# Patient Record
Sex: Female | Born: 1970 | Race: White | Hispanic: No | Smoking: Current every day smoker
Health system: Southern US, Community
[De-identification: ages and names within clinical notes are randomized; demographics above are authoritative.]

## PROBLEM LIST (undated history)

## (undated) DIAGNOSIS — E2839 Other primary ovarian failure: Secondary | ICD-10-CM

## (undated) DIAGNOSIS — F329 Major depressive disorder, single episode, unspecified: Secondary | ICD-10-CM

## (undated) DIAGNOSIS — E288 Other ovarian dysfunction: Secondary | ICD-10-CM

## (undated) DIAGNOSIS — F32A Depression, unspecified: Secondary | ICD-10-CM

## (undated) HISTORY — DX: Depression, unspecified: F32.A

## (undated) HISTORY — DX: Major depressive disorder, single episode, unspecified: F32.9

## (undated) HISTORY — PX: POLYPECTOMY: SHX149

## (undated) HISTORY — DX: Other ovarian dysfunction: E28.8

## (undated) HISTORY — DX: Other primary ovarian failure: E28.39

## (undated) HISTORY — PX: WISDOM TOOTH EXTRACTION: SHX21

---

## 1999-03-10 ENCOUNTER — Emergency Department (HOSPITAL_COMMUNITY): Admission: EM | Admit: 1999-03-10 | Discharge: 1999-03-10 | Payer: Self-pay | Admitting: Emergency Medicine

## 1999-04-13 ENCOUNTER — Emergency Department (HOSPITAL_COMMUNITY): Admission: EM | Admit: 1999-04-13 | Discharge: 1999-04-13 | Payer: Self-pay | Admitting: Emergency Medicine

## 2002-12-21 ENCOUNTER — Emergency Department (HOSPITAL_COMMUNITY): Admission: AD | Admit: 2002-12-21 | Discharge: 2002-12-21 | Payer: Self-pay | Admitting: Family Medicine

## 2004-03-30 ENCOUNTER — Ambulatory Visit: Payer: Self-pay | Admitting: Psychiatry

## 2004-03-30 ENCOUNTER — Other Ambulatory Visit (HOSPITAL_COMMUNITY): Admission: RE | Admit: 2004-03-30 | Discharge: 2004-04-03 | Payer: Self-pay | Admitting: Psychiatry

## 2004-12-19 ENCOUNTER — Other Ambulatory Visit: Admission: RE | Admit: 2004-12-19 | Discharge: 2004-12-19 | Payer: Self-pay | Admitting: Obstetrics and Gynecology

## 2006-08-02 ENCOUNTER — Other Ambulatory Visit: Admission: RE | Admit: 2006-08-02 | Discharge: 2006-08-02 | Payer: Self-pay | Admitting: Obstetrics and Gynecology

## 2009-11-04 ENCOUNTER — Ambulatory Visit: Payer: Self-pay | Admitting: Gynecology

## 2009-11-04 ENCOUNTER — Other Ambulatory Visit: Admission: RE | Admit: 2009-11-04 | Discharge: 2009-11-04 | Payer: Self-pay | Admitting: Gynecology

## 2010-05-22 ENCOUNTER — Ambulatory Visit (INDEPENDENT_AMBULATORY_CARE_PROVIDER_SITE_OTHER): Payer: Self-pay | Admitting: Gynecology

## 2010-05-22 DIAGNOSIS — Z113 Encounter for screening for infections with a predominantly sexual mode of transmission: Secondary | ICD-10-CM

## 2010-05-22 DIAGNOSIS — N898 Other specified noninflammatory disorders of vagina: Secondary | ICD-10-CM

## 2010-05-22 DIAGNOSIS — N926 Irregular menstruation, unspecified: Secondary | ICD-10-CM

## 2010-05-22 DIAGNOSIS — B373 Candidiasis of vulva and vagina: Secondary | ICD-10-CM

## 2010-05-31 ENCOUNTER — Ambulatory Visit (INDEPENDENT_AMBULATORY_CARE_PROVIDER_SITE_OTHER): Payer: Self-pay | Admitting: Gynecology

## 2010-05-31 ENCOUNTER — Other Ambulatory Visit: Payer: Self-pay | Admitting: Gynecology

## 2010-05-31 DIAGNOSIS — N949 Unspecified condition associated with female genital organs and menstrual cycle: Secondary | ICD-10-CM

## 2010-06-23 ENCOUNTER — Other Ambulatory Visit: Payer: Self-pay | Admitting: Gynecology

## 2010-06-23 DIAGNOSIS — Z1231 Encounter for screening mammogram for malignant neoplasm of breast: Secondary | ICD-10-CM

## 2010-07-06 ENCOUNTER — Ambulatory Visit (HOSPITAL_COMMUNITY)
Admission: RE | Admit: 2010-07-06 | Discharge: 2010-07-06 | Disposition: A | Discharge status: Home / Self Care | Payer: Self-pay | Source: Ambulatory Visit | Attending: Gynecology | Admitting: Gynecology

## 2010-07-06 DIAGNOSIS — Z1231 Encounter for screening mammogram for malignant neoplasm of breast: Secondary | ICD-10-CM

## 2010-07-11 ENCOUNTER — Other Ambulatory Visit: Payer: Self-pay | Admitting: Gynecology

## 2010-07-11 DIAGNOSIS — R928 Other abnormal and inconclusive findings on diagnostic imaging of breast: Secondary | ICD-10-CM

## 2010-07-18 ENCOUNTER — Ambulatory Visit
Admission: RE | Admit: 2010-07-18 | Discharge: 2010-07-18 | Disposition: A | Discharge status: Home / Self Care | Payer: Self-pay | Source: Ambulatory Visit | Attending: Gynecology | Admitting: Gynecology

## 2010-07-18 DIAGNOSIS — R928 Other abnormal and inconclusive findings on diagnostic imaging of breast: Secondary | ICD-10-CM

## 2010-11-29 ENCOUNTER — Telehealth: Payer: Self-pay | Admitting: *Deleted

## 2010-11-29 NOTE — Telephone Encounter (Signed)
Pt called complaining of foul odor and black blood from vagina, pt wanted a rx called in to pharmacy. I told pt that she would need an office visit, she declined stating that JF has called in Rx for this before. Last office visit 05/31/10. I explained to pt that Jf is out of the office today and tomorrow and she could be seen by another doctor. Pt states she has no insurance,offer to taik to appointment desk to set up payment plan, she declined. Pt says she will watch for now and call back in a couple of days to make appointment.

## 2010-12-06 ENCOUNTER — Encounter: Payer: Self-pay | Admitting: Gynecology

## 2010-12-06 ENCOUNTER — Ambulatory Visit (INDEPENDENT_AMBULATORY_CARE_PROVIDER_SITE_OTHER): Payer: Self-pay | Admitting: Gynecology

## 2010-12-06 VITALS — BP 120/80

## 2010-12-06 DIAGNOSIS — N949 Unspecified condition associated with female genital organs and menstrual cycle: Secondary | ICD-10-CM

## 2010-12-06 DIAGNOSIS — N912 Amenorrhea, unspecified: Secondary | ICD-10-CM

## 2010-12-06 DIAGNOSIS — N898 Other specified noninflammatory disorders of vagina: Secondary | ICD-10-CM

## 2010-12-06 DIAGNOSIS — N938 Other specified abnormal uterine and vaginal bleeding: Secondary | ICD-10-CM

## 2010-12-06 LAB — POCT URINE PREGNANCY: Preg Test, Ur: NEGATIVE

## 2010-12-06 NOTE — Patient Instructions (Signed)
Hubert, remember to take the Flagyl 500 mg one tablet twice a day for 5 days. The boric acid capsules (600 mg) is to be applied intravaginally every night for 2 weeks consecutively. After those 2 weeks I would like for you to apply it intravaginally twice a week for 3-6 months. Also cut down on sweets and dairy products and no vaginal douching. Also you can by a probiotic tablet such as refresh or other whatever is less expensive and you should take 1 tablet daily. We'll see her next week for the sonohysterogram and discuss the lab results that were drawn today.

## 2010-12-06 NOTE — Progress Notes (Signed)
Patient presented today to the office stating that she has had mucousy blood-tinged discharge for several weeks. Review of her records indicated she was seen in the office on April of 2012 for dysfunction uterine bleeding and had a benign endometrial biopsy. She has not been using any form of contraception cannot recall what her last menstrual period was. Also review of her record again July 2012 she was treated for bacterial vaginosis and yeast infection based on a telephone conversation and due to limited resources and could not come to the office. She is in a monogamous relationship. Patient denies any visual disturbances, unusual headaches, or nipple discharge.  Pelvic exam: Bartholin urethra Skene glands within normal limits Vagina: No lesions or discharge Cervix: No lesion discharge Uterus: Anteverted normal size shape and consistency Adnexa: No palpable masses or tenderness Rectal exam: Not done  Wet prep: Bacterial vaginosis and moniliasis  Assessment: Oligomenorrhea and vaginal discharge. Early this year benign endometrial biopsy. Patient will return back to the office next week for sonohysterogram for better assessment of intrauterine cavity. Urine pregnancy test was negative today. We'll have patient stop by the lab to check TSH and prolactin. She'll be treated with Flagyl 500 mg one by mouth twice a day for 5 days. And due to recurrent yeast infections as well we'll place her on boric acid capsule 650 mg one tablet intravaginally each bedtime for 2 weeks then twice a week thereafter for 3-6 months. She will cut down on sweets and dairy products and avoid douching. We will discuss course of management and she returned to the office next week. She is a chronic smoker so oral contraceptive pills would be a question. We did discuss the Mirena IUD and or Provera 10 mg one tablet daily for 10 days of each month.

## 2010-12-07 ENCOUNTER — Telehealth: Payer: Self-pay | Admitting: *Deleted

## 2010-12-07 NOTE — Telephone Encounter (Signed)
Pt called wanting recent lab result, results given to pt. She also wanted rx for stress related issues, I told pt that JF is out of the office until next Wednesday. Pt says she will call back then.

## 2010-12-28 ENCOUNTER — Telehealth: Payer: Self-pay | Admitting: *Deleted

## 2010-12-28 ENCOUNTER — Other Ambulatory Visit: Payer: Self-pay | Admitting: Gynecology

## 2010-12-28 ENCOUNTER — Other Ambulatory Visit: Payer: Self-pay | Admitting: *Deleted

## 2010-12-28 DIAGNOSIS — R921 Mammographic calcification found on diagnostic imaging of breast: Secondary | ICD-10-CM

## 2010-12-28 NOTE — Telephone Encounter (Signed)
Pt called stating she need a order for 6 month follow up at the breast center. Pt informed that the breast center will fax [place] the order for this. Pt also stated that she has brownish blood then bright red blood discharge while on boric acid for the first two weeks. Pt is no longer having this discharge and was told to watch for now if discharge should return she will call office.

## 2011-01-12 ENCOUNTER — Encounter: Payer: Self-pay | Admitting: Gynecology

## 2011-01-17 ENCOUNTER — Ambulatory Visit
Admission: RE | Admit: 2011-01-17 | Discharge: 2011-01-17 | Disposition: A | Discharge status: Home / Self Care | Payer: No Typology Code available for payment source | Source: Ambulatory Visit | Attending: Gynecology | Admitting: Gynecology

## 2011-01-17 DIAGNOSIS — R921 Mammographic calcification found on diagnostic imaging of breast: Secondary | ICD-10-CM

## 2011-07-16 ENCOUNTER — Other Ambulatory Visit: Payer: Self-pay | Admitting: *Deleted

## 2011-07-16 DIAGNOSIS — R921 Mammographic calcification found on diagnostic imaging of breast: Secondary | ICD-10-CM

## 2011-08-06 ENCOUNTER — Ambulatory Visit
Admission: RE | Admit: 2011-08-06 | Discharge: 2011-08-06 | Disposition: A | Discharge status: Home / Self Care | Payer: Self-pay | Source: Ambulatory Visit | Attending: Gynecology | Admitting: Gynecology

## 2011-08-06 DIAGNOSIS — R921 Mammographic calcification found on diagnostic imaging of breast: Secondary | ICD-10-CM

## 2011-11-13 ENCOUNTER — Telehealth: Payer: Self-pay | Admitting: *Deleted

## 2011-11-13 NOTE — Telephone Encounter (Signed)
Pt called c/o UTI requesting rx. Pt last appointment in oct. 2012. Pt informed OV best. Pt said she will call back to make OV.

## 2012-01-18 ENCOUNTER — Telehealth: Payer: Self-pay | Admitting: *Deleted

## 2012-01-18 DIAGNOSIS — R921 Mammographic calcification found on diagnostic imaging of breast: Secondary | ICD-10-CM

## 2012-01-18 NOTE — Telephone Encounter (Signed)
Per breast center pt will need 6 month follow up for left breast, order placed and pt informed to call.

## 2012-01-21 ENCOUNTER — Encounter (HOSPITAL_COMMUNITY): Payer: Self-pay | Admitting: *Deleted

## 2012-01-29 ENCOUNTER — Ambulatory Visit (HOSPITAL_COMMUNITY): Payer: Self-pay

## 2012-02-25 ENCOUNTER — Emergency Department (INDEPENDENT_AMBULATORY_CARE_PROVIDER_SITE_OTHER)
Admission: EM | Admit: 2012-02-25 | Discharge: 2012-02-25 | Disposition: A | Discharge status: Home / Self Care | Payer: Self-pay | Source: Home / Self Care

## 2012-02-25 ENCOUNTER — Encounter (HOSPITAL_COMMUNITY): Payer: Self-pay | Admitting: Emergency Medicine

## 2012-02-25 DIAGNOSIS — H60399 Other infective otitis externa, unspecified ear: Secondary | ICD-10-CM

## 2012-02-25 DIAGNOSIS — H6090 Unspecified otitis externa, unspecified ear: Secondary | ICD-10-CM

## 2012-02-25 DIAGNOSIS — J029 Acute pharyngitis, unspecified: Secondary | ICD-10-CM

## 2012-02-25 LAB — POCT INFECTIOUS MONO SCREEN: Mono Screen: NEGATIVE

## 2012-02-25 LAB — POCT RAPID STREP A: Streptococcus, Group A Screen (Direct): NEGATIVE

## 2012-02-25 MED ORDER — NEOMYCIN-POLYMYXIN-HC 3.5-10000-1 OT SOLN: 3.0000 [drp] | Freq: Three times a day (TID) | OTIC | Status: DC

## 2012-02-25 MED ORDER — AMOXICILLIN-POT CLAVULANATE 875-125 MG PO TABS: 1.0000 | ORAL_TABLET | Freq: Two times a day (BID) | ORAL | Status: DC

## 2012-02-25 MED ORDER — HYDROCODONE-ACETAMINOPHEN 5-325 MG PO TABS: 1.0000 | ORAL_TABLET | ORAL | Status: DC | PRN

## 2012-02-25 NOTE — ED Notes (Signed)
Pt c/o right ear pain w/decraesed hearing x1.5 weeks Sx include: sore throat, dry cough, chills Denies: fevers, vomiting, nauseas, diarrhea She is alert w/no signs of acute distress

## 2012-02-25 NOTE — ED Provider Notes (Signed)
History     CSN: [id number]  Arrival date & time 02/25/12  1622   First MD Initiated Contact with Patient 02/25/12 1700      Chief Complaint  Patient presents with  . Otalgia    (Consider location/radiation/quality/duration/timing/severity/associated sxs/prior treatment) HPI Comments: 42 year old female presents with right earache for almost one week. She initially tried removing wax from her ear but that did not help. She is also complaining of decreased hearing from the right ear. States ears feel hot and she is has a sore throat and chills but no fever. Is also complaining of fatigue and malaise. She is a smoker.   Past Medical History  Diagnosis Date  . Depression     History reviewed. No pertinent past surgical history.  Family History  Problem Relation Age of Onset  . Breast cancer Mother   . Cancer Father     thyroid  . Hypertension Father   . Cancer Sister 88    thyroid  . Breast cancer Paternal Grandmother   . Cancer Daughter 74    cervical    History  Substance Use Topics  . Smoking status: Current Every Day Smoker -- 0.5 packs/day    Types: Cigarettes  . Smokeless tobacco: Never Used  . Alcohol Use: No    OB History    Grav Para Term Preterm Abortions TAB SAB Ect Mult Living   4 3 3  1  1   3       Review of Systems  Constitutional: Positive for fatigue. Negative for fever, chills, activity change and appetite change.  HENT: Positive for sore throat. Negative for congestion, facial swelling, rhinorrhea, neck pain, neck stiffness and postnasal drip.   Eyes: Negative.   Respiratory: Negative.   Cardiovascular: Negative.   Gastrointestinal: Negative.   Genitourinary: Negative.   Skin: Negative for pallor and rash.  Neurological: Negative.     Allergies  Review of patient's allergies indicates no known allergies.  Home Medications   Current Outpatient Rx  Name  Route  Sig  Dispense  Refill  . AMOXICILLIN-POT CLAVULANATE 875-125 MG PO TABS   Oral   Take 1 tablet by mouth every 12 (twelve) hours.   14 tablet   0   . BUPROPION HCL 100 MG PO TABS   Oral   Take 100 mg by mouth 2 (two) times daily.           Marland Kitchen FLAX SEED OIL 1000 MG PO CAPS   Oral   Take by mouth.           Marland Kitchen HYDROCODONE-ACETAMINOPHEN 5-325 MG PO TABS   Oral   Take 1 tablet by mouth every 4 (four) hours as needed for pain.   15 tablet   0   . HAIR VITAMINS PO   Oral   Take by mouth.             BP 134/81  Pulse 82  Temp 98 F (36.7 C) (Oral)  Resp 16  SpO2 100%  Physical Exam  Nursing note and vitals reviewed. Constitutional: She is oriented to person, place, and time. She appears well-developed and well-nourished. No distress.  HENT:       Left EAC and TM are normal. Right EAC is swollen and unable to visualize the TM. There is no redness or drainage however the EAC is quite tender. Oropharynx with erythema  and mild swelling; no exudate  Eyes: Conjunctivae normal and EOM are normal.  Neck: Normal range of  motion. Neck supple.       Right anterior cervical lymphadenopathy. Right posterior cervical lymphadenopathy.  Cardiovascular: Normal rate, regular rhythm and normal heart sounds.   Pulmonary/Chest: Effort normal and breath sounds normal. No respiratory distress. She has no wheezes.  Musculoskeletal: Normal range of motion. She exhibits no edema.  Lymphadenopathy:    She has cervical adenopathy.  Neurological: She is alert and oriented to person, place, and time.  Skin: Skin is warm and dry. No rash noted.  Psychiatric: She has a normal mood and affect.    ED Course  Procedures (including critical care time)   Labs Reviewed  POCT RAPID STREP A ([facility] ONLY)  POCT INFECTIOUS MONO SCREEN   No results found.   1. Otitis externa   2. Pharyngitis       MDM  Cortisporin HC otic drops 3 times a day. I inserted an ear wick and applied 4 drops into the right ear canal. She will have the remainder of the pack to take  home and change every day and apply drops 3 times a day Augmentin as directed Norco 5 one every 4 hours when necessary pain #15 I also suspect that she may have a viral syndrome with her minor sore throat or, fatigue and malaise. Treatment plan fluids Results for orders placed during the hospital encounter of 02/25/12  POCT RAPID STREP A ([facility] ONLY)      Component Value Range   Streptococcus, Group A Screen (Direct) NEGATIVE  NEGATIVE  POCT INFECTIOUS MONO SCREEN      Component Value Range   Mono Screen NEGATIVE  NEGATIVE           Hayden Rasmussen, NP 02/25/12 1835

## 2012-02-25 NOTE — ED Notes (Signed)
Waiting discharge papers 

## 2012-02-26 NOTE — ED Notes (Signed)
Pt called and questioned directions - discussed with david mabe - pt instructions reviewed again with pt - verbalized understanding

## 2012-02-27 NOTE — ED Notes (Signed)
Patient called, asking to be out of work until end of week; spoke w D Mabe, NP, and this request has been denied. Patient was informed of this, and advised that if she cannot work in AM, will need to be re-examined here or by her primary MD

## 2012-03-01 NOTE — ED Provider Notes (Signed)
Medical screening examination/treatment/procedure(s) were performed by resident physician or non-physician practitioner and as supervising physician I was immediately available for consultation/collaboration.   Barkley Bruns MD.    Linna Hoff, MD 03/01/12 [phone]

## 2012-04-09 ENCOUNTER — Ambulatory Visit
Admission: RE | Admit: 2012-04-09 | Discharge: 2012-04-09 | Disposition: A | Discharge status: Home / Self Care | Payer: No Typology Code available for payment source | Source: Ambulatory Visit | Attending: Gynecology | Admitting: Gynecology

## 2012-04-10 ENCOUNTER — Encounter (HOSPITAL_COMMUNITY): Payer: Self-pay | Admitting: *Deleted

## 2012-04-15 ENCOUNTER — Other Ambulatory Visit: Payer: Self-pay | Admitting: Obstetrics and Gynecology

## 2012-04-22 ENCOUNTER — Encounter (HOSPITAL_COMMUNITY): Payer: Self-pay

## 2012-04-22 ENCOUNTER — Ambulatory Visit (HOSPITAL_COMMUNITY)
Admission: RE | Admit: 2012-04-22 | Discharge: 2012-04-22 | Disposition: A | Discharge status: Home / Self Care | Payer: Self-pay | Source: Ambulatory Visit | Attending: Obstetrics and Gynecology | Admitting: Obstetrics and Gynecology

## 2012-04-22 VITALS — BP 116/74 | Temp 98.4°F | Ht 65.0 in | Wt 139.4 lb

## 2012-04-22 NOTE — Patient Instructions (Signed)
Taught patient how to perform BSE. Patient did not need a Pap smear today due to last Pap smear was 11/04/2009. Let her know that her next Pap smear is due in September 2014. Told her she can call [facility] around September and schedule an appointment for her Pap smear. Let her know she can also come to one of the free Pap smears this fall. Referred patient to the [facility] for left breast biopsy per recommendation. Appointment scheduled for Wednesday, April 23, 2012 at 1300. Patient aware of appointment and will be there. Patient verbalized understanding.

## 2012-04-22 NOTE — Progress Notes (Signed)
Patient referred to [facility] by the [facility] due to recommending a left breast biopsy. Left breast diagnostic mammogram completed at the [facility] 04/09/2012.  Pap Smear:    Pap smear not completed today. Last Pap smear was 11/04/2009 and normal. Per patient no history of abnormal Pap smears. Last Pap smear result is in EPIC.  Physical exam: Breasts Breasts symmetrical. No skin abnormalities bilateral breasts. No nipple retraction bilateral breasts. No nipple discharge bilateral breasts. No lymphadenopathy. No lumps palpated right breast. Palpated a small lump within the left breast at 1 o'clock around 2 cm from the nipple. No complaints of pain or tenderness on exam. Referred patient to the [facility] for left breast biopsy per recommendation. Appointment scheduled for Wednesday, April 23, 2012 at 1300     Pelvic/Bimanual No Pap smear completed today since last Pap smear was 11/04/2009. Pap smear not indicated per BCCCP guidelines.

## 2012-04-23 ENCOUNTER — Ambulatory Visit
Admission: RE | Admit: 2012-04-23 | Discharge: 2012-04-23 | Disposition: A | Discharge status: Home / Self Care | Payer: No Typology Code available for payment source | Source: Ambulatory Visit | Attending: Obstetrics and Gynecology | Admitting: Obstetrics and Gynecology

## 2012-04-23 HISTORY — PX: BREAST BIOPSY: SHX20

## 2012-09-30 ENCOUNTER — Other Ambulatory Visit: Payer: Self-pay

## 2012-09-30 DIAGNOSIS — Z1231 Encounter for screening mammogram for malignant neoplasm of breast: Secondary | ICD-10-CM

## 2012-10-20 ENCOUNTER — Ambulatory Visit: Payer: Self-pay

## 2012-10-20 ENCOUNTER — Ambulatory Visit
Admission: RE | Admit: 2012-10-20 | Discharge: 2012-10-20 | Disposition: A | Discharge status: Home / Self Care | Payer: No Typology Code available for payment source | Source: Ambulatory Visit

## 2012-10-20 DIAGNOSIS — Z1231 Encounter for screening mammogram for malignant neoplasm of breast: Secondary | ICD-10-CM

## 2012-10-28 ENCOUNTER — Encounter (HOSPITAL_COMMUNITY): Payer: Self-pay

## 2012-10-28 ENCOUNTER — Ambulatory Visit (HOSPITAL_COMMUNITY)
Admission: RE | Admit: 2012-10-28 | Discharge: 2012-10-28 | Disposition: A | Discharge status: Home / Self Care | Payer: Self-pay | Source: Ambulatory Visit | Attending: Obstetrics and Gynecology | Admitting: Obstetrics and Gynecology

## 2012-10-28 VITALS — BP 120/72 | Temp 98.7°F | Ht 65.5 in | Wt 143.0 lb

## 2012-10-28 DIAGNOSIS — Z01419 Encounter for gynecological examination (general) (routine) without abnormal findings: Secondary | ICD-10-CM

## 2012-10-28 DIAGNOSIS — N898 Other specified noninflammatory disorders of vagina: Secondary | ICD-10-CM

## 2012-10-28 NOTE — Patient Instructions (Signed)
Informed patient that if today's Pap smear is normal that her next Pap smear will be due in 3 years. Let patient know will follow up with her within the next couple weeks with results by phone. Melanie Owens verbalized understanding.  Melanie Owens, Melanie Maser, RN 1:18 PM

## 2012-10-28 NOTE — Progress Notes (Signed)
No complaints today.  Pap Smear:    Pap smear completed today. Patients last Pap smear was 11/04/2009 and normal. Per patient has no history of an abnormal Pap smear. Pap smear result above is in EPIC.    Pelvic/Bimanual   Ext Genitalia No lesions, no swelling and no discharge observed on external genitalia.         Vagina Vagina pink and normal texture. No lesions and white milky discharge observed in vagina. Wet prep completed.          Cervix Cervix is present. Cervix slightly red and of normal texture. White milky discharge observed on cervix.Marland Kitchen     Uterus Uterus is present and palpable. Uterus in normal position and normal size.        Adnexae Bilateral ovaries present and palpable. No tenderness on palpation.          Rectovaginal No rectal exam completed today since patient had no rectal complaints. No skin abnormalities observed on exam.

## 2012-11-03 ENCOUNTER — Telehealth (HOSPITAL_COMMUNITY): Payer: Self-pay | Admitting: *Deleted

## 2012-11-03 ENCOUNTER — Other Ambulatory Visit: Payer: Self-pay | Admitting: *Deleted

## 2012-11-03 DIAGNOSIS — B9689 Other specified bacterial agents as the cause of diseases classified elsewhere: Secondary | ICD-10-CM

## 2012-11-03 DIAGNOSIS — B379 Candidiasis, unspecified: Secondary | ICD-10-CM

## 2012-11-03 MED ORDER — FLUCONAZOLE 150 MG PO TABS: 150.0000 mg | ORAL_TABLET | Freq: Once | ORAL | Status: DC

## 2012-11-03 MED ORDER — METRONIDAZOLE 500 MG PO TABS: 500.0000 mg | ORAL_TABLET | Freq: Two times a day (BID) | ORAL | Status: DC

## 2012-11-03 NOTE — Telephone Encounter (Signed)
Telephoned patient at home # and discussed negative pap smear results. Next pap due in 3 years. Advised patient of wet prep results and medication called in to pharmacy for bacterial vaginosis and yeast. Patient voiced understanding.

## 2013-04-16 ENCOUNTER — Other Ambulatory Visit: Payer: Self-pay

## 2013-04-16 DIAGNOSIS — Z1231 Encounter for screening mammogram for malignant neoplasm of breast: Secondary | ICD-10-CM

## 2013-05-29 ENCOUNTER — Ambulatory Visit (INDEPENDENT_AMBULATORY_CARE_PROVIDER_SITE_OTHER): Payer: BC Managed Care – PPO | Admitting: Gynecology

## 2013-05-29 ENCOUNTER — Encounter: Payer: Self-pay | Admitting: Gynecology

## 2013-05-29 VITALS — BP 128/78 | Ht 65.0 in | Wt 148.0 lb

## 2013-05-29 DIAGNOSIS — F17200 Nicotine dependence, unspecified, uncomplicated: Secondary | ICD-10-CM

## 2013-05-29 DIAGNOSIS — N898 Other specified noninflammatory disorders of vagina: Secondary | ICD-10-CM

## 2013-05-29 DIAGNOSIS — N760 Acute vaginitis: Secondary | ICD-10-CM

## 2013-05-29 DIAGNOSIS — R143 Flatulence: Secondary | ICD-10-CM

## 2013-05-29 DIAGNOSIS — K921 Melena: Secondary | ICD-10-CM

## 2013-05-29 DIAGNOSIS — R141 Gas pain: Secondary | ICD-10-CM

## 2013-05-29 DIAGNOSIS — A499 Bacterial infection, unspecified: Secondary | ICD-10-CM

## 2013-05-29 DIAGNOSIS — R142 Eructation: Secondary | ICD-10-CM

## 2013-05-29 DIAGNOSIS — R140 Abdominal distension (gaseous): Secondary | ICD-10-CM | POA: Insufficient documentation

## 2013-05-29 DIAGNOSIS — B9689 Other specified bacterial agents as the cause of diseases classified elsewhere: Secondary | ICD-10-CM

## 2013-05-29 DIAGNOSIS — N912 Amenorrhea, unspecified: Secondary | ICD-10-CM

## 2013-05-29 DIAGNOSIS — Z803 Family history of malignant neoplasm of breast: Secondary | ICD-10-CM | POA: Insufficient documentation

## 2013-05-29 DIAGNOSIS — N951 Menopausal and female climacteric states: Secondary | ICD-10-CM

## 2013-05-29 DIAGNOSIS — N643 Galactorrhea not associated with childbirth: Secondary | ICD-10-CM | POA: Insufficient documentation

## 2013-05-29 LAB — WET PREP FOR TRICH, YEAST, CLUE
Trich, Wet Prep: NONE SEEN
YEAST WET PREP: NONE SEEN

## 2013-05-29 LAB — PREGNANCY, URINE: Preg Test, Ur: NEGATIVE

## 2013-05-29 MED ORDER — TINIDAZOLE 500 MG PO TABS: ORAL_TABLET | ORAL | Status: DC

## 2013-05-29 NOTE — Patient Instructions (Signed)
Smoking Cessation Quitting smoking is important to your health and has many advantages. However, it is not always easy to quit since nicotine is a very addictive drug. Often times, people try 3 times or more before being able to quit. This document explains the best ways for you to prepare to quit smoking. Quitting takes hard work and a lot of effort, but you can do it. ADVANTAGES OF QUITTING SMOKING  You will live longer, feel better, and live better.  Your body will feel the impact of quitting smoking almost immediately.  Within 20 minutes, blood pressure decreases. Your pulse returns to its normal level.  After 8 hours, carbon monoxide levels in the blood return to normal. Your oxygen level increases.  After 24 hours, the chance of having a heart attack starts to decrease. Your breath, hair, and body stop smelling like smoke.  After 48 hours, damaged nerve endings begin to recover. Your sense of taste and smell improve.  After 72 hours, the body is virtually free of nicotine. Your bronchial tubes relax and breathing becomes easier.  After 2 to 12 weeks, lungs can hold more air. Exercise becomes easier and circulation improves.  The risk of having a heart attack, stroke, cancer, or lung disease is greatly reduced.  After 1 year, the risk of coronary heart disease is cut in half.  After 5 years, the risk of stroke falls to the same as a nonsmoker.  After 10 years, the risk of lung cancer is cut in half and the risk of other cancers decreases significantly.  After 15 years, the risk of coronary heart disease drops, usually to the level of a nonsmoker.  If you are pregnant, quitting smoking will improve your chances of having a healthy baby.  The people you live with, especially any children, will be healthier.  You will have extra money to spend on things other than cigarettes. QUESTIONS TO THINK ABOUT BEFORE ATTEMPTING TO QUIT You may want to talk about your answers with your  caregiver.  Why do you want to quit?  If you tried to quit in the past, what helped and what did not?  What will be the most difficult situations for you after you quit? How will you plan to handle them?  Who can help you through the tough times? Your family? Friends? A caregiver?  What pleasures do you get from smoking? What ways can you still get pleasure if you quit? Here are some questions to ask your caregiver:  How can you help me to be successful at quitting?  What medicine do you think would be best for me and how should I take it?  What should I do if I need more help?  What is smoking withdrawal like? How can I get information on withdrawal? GET READY  Set a quit date.  Change your environment by getting rid of all cigarettes, ashtrays, matches, and lighters in your home, car, or work. Do not let people smoke in your home.  Review your past attempts to quit. Think about what worked and what did not. GET SUPPORT AND ENCOURAGEMENT You have a better chance of being successful if you have help. You can get support in many ways.  Tell your family, friends, and co-workers that you are going to quit and need their support. Ask them not to smoke around you.  Get individual, group, or telephone counseling and support. Programs are available at local hospitals and health centers. Call your local health department for   information about programs in your area.  Spiritual beliefs and practices may help some smokers quit.  Download a "quit meter" on your computer to keep track of quit statistics, such as how long you have gone without smoking, cigarettes not smoked, and money saved.  Get a self-help book about quitting smoking and staying off of tobacco. [place] yourself from urges to smoke. Talk to someone, go for a walk, or occupy your time with a task.  Change your normal routine. Take a different route to work. Drink tea instead of coffee.  Eat breakfast in a different place.  Reduce your stress. Take a hot bath, exercise, or read a book.  Plan something enjoyable to do every day. Reward yourself for not smoking.  Explore interactive web-based programs that specialize in helping you quit. GET MEDICINE AND USE IT CORRECTLY Medicines can help you stop smoking and decrease the urge to smoke. Combining medicine with the above behavioral methods and support can greatly increase your chances of successfully quitting smoking.  Nicotine replacement therapy helps deliver nicotine to your body without the negative effects and risks of smoking. Nicotine replacement therapy includes nicotine gum, lozenges, inhalers, nasal sprays, and skin patches. Some may be available over-the-counter and others require a prescription.  Antidepressant medicine helps people abstain from smoking, but how this works is unknown. This medicine is available by prescription.  Nicotinic receptor partial agonist medicine simulates the effect of nicotine in your brain. This medicine is available by prescription. Ask your caregiver for advice about which medicines to use and how to use them based on your health history. Your caregiver will tell you what side effects to look out for if you choose to be on a medicine or therapy. Carefully read the information on the package. Do not use any other product containing nicotine while using a nicotine replacement product.  RELAPSE OR DIFFICULT SITUATIONS Most relapses occur within the first 3 months after quitting. Do not be discouraged if you start smoking again. Remember, most people try several times before finally quitting. You may have symptoms of withdrawal because your body is used to nicotine. You may crave cigarettes, be irritable, feel very hungry, cough often, get headaches, or have difficulty concentrating. The withdrawal symptoms are only temporary. They are strongest when you first quit, but they will go away within  10 14 days. To reduce the chances of relapse, try to:  Avoid drinking alcohol. Drinking lowers your chances of successfully quitting.  Reduce the amount of caffeine you consume. Once you quit smoking, the amount of caffeine in your body increases and can give you symptoms, such as a rapid heartbeat, sweating, and anxiety.  Avoid smokers because they can make you want to smoke.  Do not let weight gain distract you. Many smokers will gain weight when they quit, usually less than 10 pounds. Eat a healthy diet and stay active. You can always lose the weight gained after you quit.  Find ways to improve your mood other than smoking. FOR MORE INFORMATION  www.smokefree.gov  Document Released: 01/23/2001 Document Revised: 07/31/2011 Document Reviewed: 05/10/2011 [facility] Patient Information 2014 [place], [place]. Hormone Therapy At menopause, your body begins making less estrogen and progesterone hormones. This causes the body to stop having menstrual periods. This is because estrogen and progesterone hormones control your periods and menstrual cycle. A lack of estrogen may cause symptoms such as:  Hot flushes (or hot flashes).  Vaginal dryness.  Dry skin.  Loss  of sex drive.  Risk of bone loss (osteoporosis). When this happens, you may choose to take hormone therapy to get back the estrogen lost during menopause. When the hormone estrogen is given alone, it is usually referred to as ET (Estrogen Therapy). When the hormone progestin is combined with estrogen, it is generally called HT (Hormone Therapy). This was formerly known as hormone replacement therapy (HRT). Your caregiver can help you make a decision on what will be best for you. The decision to use HT seems to change often as new studies are done. Many studies do not agree on the benefits of hormone replacement therapy. LIKELY BENEFITS OF HT INCLUDE PROTECTION FROM:  Hot Flushes (also called hot flashes) - A hot flush is a sudden  feeling of heat that spreads over the face and body. The skin may redden like a blush. It is connected with sweats and sleep disturbance. Women going through menopause may have hot flushes a few times a month or several times per day depending on the woman.  Osteoporosis (bone loss)- Estrogen helps guard against bone loss. After menopause, a woman's bones slowly lose calcium and become weak and brittle. As a result, bones are more likely to break. The hip, wrist, and spine are affected most often. Hormone therapy can help slow bone loss after menopause. Weight bearing exercise and taking calcium with vitamin D also can help prevent bone loss. There are also medications that your caregiver can prescribe that can help prevent osteoporosis.  Vaginal Dryness - Loss of estrogen causes changes in the vagina. Its lining may become thin and dry. These changes can cause pain and bleeding during sexual intercourse. Dryness can also lead to infections. This can cause burning and itching. (Vaginal estrogen treatment can help relieve pain, itching, and dryness.)  Urinary Tract Infections are more common after menopause because of lack of estrogen. Some women also develop urinary incontinence because of low estrogen levels in the vagina and bladder.  Possible other benefits of estrogen include a positive effect on mood and short-term memory in women. RISKS AND COMPLICATIONS  Using estrogen alone without progesterone causes the lining of the uterus to grow. This increases the risk of lining of the uterus (endometrial) cancer. Your caregiver should give another hormone called progestin if you have a uterus.  Women who take combined (estrogen and progestin) HT appear to have an increased risk of breast cancer. The risk appears to be small, but increases throughout the time that HT is taken.  Combined therapy also makes the breast tissue slightly denser which makes it harder to read mammograms (breast  X-rays).  Combined, estrogen and progesterone therapy can be taken together every day, in which case there may be spotting of blood. HT therapy can be taken cyclically in which case you will have menstrual periods. Cyclically means HT is taken for a set amount of days, then not taken, then this process is repeated.  HT may increase the risk of stroke, heart attack, breast cancer and forming blood clots in your leg.  Transdermal estrogen (estrogen that is absorbed through the skin with a patch or a cream) may have more positive results with:  Cholesterol.  Blood pressure.  Blood clots. Having the following conditions may indicate you should not have HT:  Endometrial cancer.  Liver disease.  Breast cancer.  Heart disease.  History of blood clots.  Stroke. TREATMENT   If you choose to take HT and have a uterus, usually estrogen and progestin are prescribed.  Your caregiver will help you decide the best way to take the medications.  Possible ways to take estrogen include:  Pills.  Patches.  Gels.  Sprays.  Vaginal estrogen cream, rings and tablets.  It is best to take the lowest dose possible that will help your symptoms and take them for the shortest period of time that you can.  Hormone therapy can help relieve some of the problems (symptoms) that affect women at menopause. Before making a decision about HT, talk to your caregiver about what is best for you. Be well informed and comfortable with your decisions. HOME CARE INSTRUCTIONS   Follow your caregivers advice when taking the medications.  A Pap test is done to screen for cervical cancer.  The first Pap test should be done at age 22.  Between ages 40 and 40, Pap tests are repeated every 2 years.  Beginning at age 23, you are advised to have a Pap test every 3 years as long as your past 3 Pap tests have been normal.  Some women have medical problems that increase the chance of getting cervical cancer. Talk  to your caregiver about these problems. It is especially important to talk to your caregiver if a new problem develops soon after your last Pap test. In these cases, your caregiver may recommend more frequent screening and Pap tests.  The above recommendations are the same for women who have or have not gotten the vaccine for HPV (Human Papillomavirus).  If you had a hysterectomy for a problem that was not a cancer or a condition that could lead to cancer, then you no longer need Pap tests. However, even if you no longer need a Pap test, a regular exam is a good idea to make sure no other problems are starting.   If you are between ages 7 and 73, and you have had normal Pap tests going back 10 years, you no longer need Pap tests. However, even if you no longer need a Pap test, a regular exam is a good idea to make sure no other problems are starting.   If you have had past treatment for cervical cancer or a condition that could lead to cancer, you need Pap tests and screening for cancer for at least 20 years after your treatment.  If Pap tests have been discontinued, risk factors (such as a new sexual partner) need to be re-assessed to determine if screening should be resumed.  Some women may need screenings more often if they are at high risk for cervical cancer.  Get mammograms done as per the advice of your caregiver. SEEK IMMEDIATE MEDICAL CARE IF:  You develop abnormal vaginal bleeding.  You have pain or swelling in your legs, shortness of breath, or chest pain.  You develop dizziness or headaches.  You have lumps or changes in your breasts or armpits.  You have slurred speech.  You develop weakness or numbness of your arms or legs.  You have pain, burning, or bleeding when urinating.  You develop abdominal pain. Document Released: 10/28/2002 Document Revised: 04/23/2011 Document Reviewed: 02/15/2010 [facility] Patient Information 2014 [place], [place]. Menopause Menopause  is the normal time of life when menstrual periods stop completely. Menopause is complete when you have missed 12 consecutive menstrual periods. It usually occurs between the ages of 64 years and 33 years. Very rarely does a woman develop menopause before the age of 90 years. At menopause, your ovaries stop producing the female hormones estrogen and progesterone. This can  cause undesirable symptoms and also affect your health. Sometimes the symptoms may occur 4 5 years before the menopause begins. There is no relationship between menopause and:  Oral contraceptives.  Number of children you had.  Race.  The age your menstrual periods started (menarche). Heavy smokers and very thin women may develop menopause earlier in life. CAUSES  The ovaries stop producing the female hormones estrogen and progesterone.  Other causes include:  Surgery to remove both ovaries.  The ovaries stop functioning for no known reason.  Tumors of the pituitary gland in the brain.  Medical disease that affects the ovaries and hormone production.  Radiation treatment to the abdomen or pelvis.  Chemotherapy that affects the ovaries. SYMPTOMS   Hot flashes.  Night sweats.  Decrease in sex drive.  Vaginal dryness and thinning of the vagina causing painful intercourse.  Dryness of the skin and developing wrinkles.  Headaches.  Tiredness.  Irritability.  Memory problems.  Weight gain.  Bladder infections.  Hair growth of the face and chest.  Infertility. More serious symptoms include:  Loss of bone (osteoporosis) causing breaks (fractures).  Depression.  Hardening and narrowing of the arteries (atherosclerosis) causing heart attacks and strokes. DIAGNOSIS   When the menstrual periods have stopped for 12 straight months.  Physical exam.  Hormone studies of the blood. TREATMENT  There are many treatment choices and nearly as many questions about them. The decisions to treat or not to  treat menopausal changes is an individual choice made with your health care provider. Your health care provider can discuss the treatments with you. Together, you can decide which treatment will work best for you. Your treatment choices may include:   Hormone therapy (estrogen and progesterone).  Non-hormonal medicines.  Treating the individual symptoms with medicine (for example antidepressants for depression).  Herbal medicines that may help specific symptoms.  Counseling by a psychiatrist or psychologist.  Group therapy.  Lifestyle changes including:  Eating healthy.  Regular exercise.  Limiting caffeine and alcohol.  Stress management and meditation.  No treatment. HOME CARE INSTRUCTIONS   Take the medicine your health care provider gives you as directed.  Get plenty of sleep and rest.  Exercise regularly.  Eat a diet that contains calcium (good for the bones) and soy products (acts like estrogen hormone).  Avoid alcoholic beverages.  Do not smoke.  If you have hot flashes, dress in layers.  Take supplements, calcium, and vitamin D to strengthen bones.  You can use over-the-counter lubricants or moisturizers for vaginal dryness.  Group therapy is sometimes very helpful.  Acupuncture may be helpful in some cases. SEEK MEDICAL CARE IF:   You are not sure you are in menopause.  You are having menopausal symptoms and need advice and treatment.  You are still having menstrual periods after age 58 years.  You have pain with intercourse.  Menopause is complete (no menstrual period for 12 months) and you develop vaginal bleeding.  You need a referral to a specialist (gynecologist, psychiatrist, or psychologist) for treatment. SEEK IMMEDIATE MEDICAL CARE IF:   You have severe depression.  You have excessive vaginal bleeding.  You fell and think you have a broken bone.  You have pain when you urinate.  You develop leg or chest pain.  You have a fast  pounding heart beat (palpitations).  You have severe headaches.  You develop vision problems.  You feel a lump in your breast.  You have abdominal pain or severe indigestion. Document Released: 04/21/2003  Document Revised: 10/01/2012 Document Reviewed: 08/28/2012 [facility] Patient Information 2014 [place]. Bacterial Vaginosis Bacterial vaginosis is a vaginal infection that occurs when the normal balance of bacteria in the vagina is disrupted. It results from an overgrowth of certain bacteria. This is the most common vaginal infection in women of childbearing age. Treatment is important to prevent complications, especially in pregnant women, as it can cause a premature delivery. CAUSES  Bacterial vaginosis is caused by an increase in harmful bacteria that are normally present in smaller amounts in the vagina. Several different kinds of bacteria can cause bacterial vaginosis. However, the reason that the condition develops is not fully understood. RISK FACTORS Certain activities or behaviors can put you at an increased risk of developing bacterial vaginosis, including:  Having a new sex partner or multiple sex partners.  Douching.  Using an intrauterine device (IUD) for contraception. Women do not get bacterial vaginosis from toilet seats, bedding, swimming pools, or contact with objects around them. SIGNS AND SYMPTOMS  Some women with bacterial vaginosis have no signs or symptoms. Common symptoms include:  Grey vaginal discharge.  A fishlike odor with discharge, especially after sexual intercourse.  Itching or burning of the vagina and vulva.  Burning or pain with urination. DIAGNOSIS  Your health care provider will take a medical history and examine the vagina for signs of bacterial vaginosis. A sample of vaginal fluid may be taken. Your health care provider will look at this sample under a microscope to check for bacteria and abnormal cells. A vaginal pH test may also be  done.  TREATMENT  Bacterial vaginosis may be treated with antibiotic medicines. These may be given in the form of a pill or a vaginal cream. A second round of antibiotics may be prescribed if the condition comes back after treatment.  HOME CARE INSTRUCTIONS   Only take over-the-counter or prescription medicines as directed by your health care provider.  If antibiotic medicine was prescribed, take it as directed. Make sure you finish it even if you start to feel better.  Do not have sex until treatment is completed.  Tell all sexual partners that you have a vaginal infection. They should see their health care provider and be treated if they have problems, such as a mild rash or itching.  Practice safe sex by using condoms and only having one sex partner. SEEK MEDICAL CARE IF:   Your symptoms are not improving after 3 days of treatment.  You have increased discharge or pain.  You have a fever. MAKE SURE YOU:   Understand these instructions.  Will watch your condition.  Will get help right away if you are not doing well or get worse. FOR MORE INFORMATION  Centers for Disease Control and Prevention, Division of STD Prevention: [employer]raiser[employer]raud[employer] American Sexual Health Association (ASHA): www.ashastd.org  Document Released: 01/29/2005 Document Revised: 11/19/2012 Document Reviewed: 09/10/2012 [facility] Patient Information 2014 [place].

## 2013-05-29 NOTE — Progress Notes (Signed)
   Patient is a 43 year old who is not been seen in the office was 2012 and since that time states that she has not had a menstrual cycle. She's been complaining of fatigue, weight gain hot flashes irritability mood swings and decreased libido. Patient's mother with history of breast cancer. Patient was also having a vaginal discharge and states that she has had some galactorrhea but no visual disturbances or any unusual headaches. She did state that several months ago she did notice some blood in her stool in her father had been diagnosed with colon polyps in the 50s. Patient does smoke a pack of cigarettes per day. She stated that the GYN clinic at [facility] she had a normal Pap smear September 2014. She had a normal mammogram September 2014 also. Patient 2012 had a left benign breast biopsy. Patient is in a monogamous relationship. Patient is also complaining of a slight vaginal discharge with odor.  Exam: Abdomen: Soft nontender no rebound or guarding Pelvic: The urethra Skene was within normal limits Vagina: Gray foul-smelling discharge was noted Cervix: No gross lesions Uterus: Anteverted questionable fullness left adnexa some tenderness noted right adnexa difficult to evaluate as well. Rectal exam: Not done  Wet prep:Amine Pos, many clue cells, 2 numerous to count bacteria rare WBC  Urine pregnancy test negative  Assessment/plan: #1 bacterial vaginosis will be treated with Tindamax 500 mg 4 tablets today to repeat in 24 hours. #2 signs and symptoms suspicious for premature ovarian failure we'll check an FSH, TSH and prolactin. #3 isolated event of hematochezia father with colon polyps given the patient fecal Hemoccult cards to bring to the office next week. #4 because of patient's bloating and abdominal discomfort over like to get an ultrasound next week for better assessment of her uterus and ovaries. #5 chronic smoker one pack per day was counseled and literature and information  was provided as well as on the menopause.

## 2013-05-30 LAB — FOLLICLE STIMULATING HORMONE: FSH: 93.8 m[IU]/mL

## 2013-05-30 LAB — PROLACTIN: Prolactin: 8.7 ng/mL

## 2013-05-30 LAB — TSH: TSH: 2.356 u[IU]/mL (ref 0.350–4.500)

## 2013-06-02 ENCOUNTER — Telehealth: Payer: Self-pay | Admitting: *Deleted

## 2013-06-02 DIAGNOSIS — N912 Amenorrhea, unspecified: Secondary | ICD-10-CM

## 2013-06-02 NOTE — Telephone Encounter (Signed)
No, she does need her mammogram to September

## 2013-06-02 NOTE — Telephone Encounter (Signed)
Pt was informed with the below note, pt asked if any addtional blood work to check for cancer could be done?

## 2013-06-02 NOTE — Telephone Encounter (Signed)
Pt will follow up for ultrasound as directed.

## 2013-06-02 NOTE — Telephone Encounter (Signed)
Message copied by Thamas Jaegers on Tue Jun 02, 2013 10:11 AM ------      Message from: Terrance Mass      Created: Mon Jun 01, 2013  5:03 PM       Please informed patient that her [facility] is in the perimenopausal/menopausal range which would explain her symptoms. Her TSH and prolactin was normal. I would like to repeat her [place] in one week if still elevated we will need to see her in consultation to discuss treatment. ------

## 2013-06-03 ENCOUNTER — Ambulatory Visit (INDEPENDENT_AMBULATORY_CARE_PROVIDER_SITE_OTHER): Payer: BC Managed Care – PPO

## 2013-06-03 ENCOUNTER — Ambulatory Visit (INDEPENDENT_AMBULATORY_CARE_PROVIDER_SITE_OTHER): Payer: BC Managed Care – PPO | Admitting: Gynecology

## 2013-06-03 ENCOUNTER — Encounter: Payer: Self-pay | Admitting: Gynecology

## 2013-06-03 ENCOUNTER — Other Ambulatory Visit: Payer: Self-pay | Admitting: Gynecology

## 2013-06-03 VITALS — BP 128/70

## 2013-06-03 DIAGNOSIS — N951 Menopausal and female climacteric states: Secondary | ICD-10-CM

## 2013-06-03 DIAGNOSIS — R140 Abdominal distension (gaseous): Secondary | ICD-10-CM

## 2013-06-03 DIAGNOSIS — N912 Amenorrhea, unspecified: Secondary | ICD-10-CM

## 2013-06-03 DIAGNOSIS — R454 Irritability and anger: Secondary | ICD-10-CM

## 2013-06-03 DIAGNOSIS — G4700 Insomnia, unspecified: Secondary | ICD-10-CM

## 2013-06-03 DIAGNOSIS — K921 Melena: Secondary | ICD-10-CM

## 2013-06-03 DIAGNOSIS — R102 Pelvic and perineal pain: Secondary | ICD-10-CM

## 2013-06-03 DIAGNOSIS — N839 Noninflammatory disorder of ovary, fallopian tube and broad ligament, unspecified: Secondary | ICD-10-CM

## 2013-06-03 DIAGNOSIS — N83202 Unspecified ovarian cyst, left side: Secondary | ICD-10-CM

## 2013-06-03 DIAGNOSIS — N949 Unspecified condition associated with female genital organs and menstrual cycle: Secondary | ICD-10-CM

## 2013-06-03 DIAGNOSIS — R141 Gas pain: Secondary | ICD-10-CM

## 2013-06-03 DIAGNOSIS — R142 Eructation: Secondary | ICD-10-CM

## 2013-06-03 DIAGNOSIS — N838 Other noninflammatory disorders of ovary, fallopian tube and broad ligament: Secondary | ICD-10-CM

## 2013-06-03 DIAGNOSIS — N83209 Unspecified ovarian cyst, unspecified side: Secondary | ICD-10-CM

## 2013-06-03 DIAGNOSIS — R143 Flatulence: Secondary | ICD-10-CM

## 2013-06-03 LAB — PREGNANCY, URINE: PREG TEST UR: NEGATIVE

## 2013-06-03 MED ORDER — BUPROPION HCL ER (XL) 150 MG PO TB24: 150.0000 mg | ORAL_TABLET | Freq: Every day | ORAL | Status: DC

## 2013-06-03 MED ORDER — MEDROXYPROGESTERONE ACETATE 150 MG/ML IM SUSP
150.0000 mg | Freq: Once | INTRAMUSCULAR | Status: AC
  Administered 2013-06-03: 150 mg via INTRAMUSCULAR

## 2013-06-03 MED ORDER — ZOLPIDEM TARTRATE 10 MG PO TABS: 10.0000 mg | ORAL_TABLET | Freq: Every evening | ORAL | Status: DC | PRN

## 2013-06-03 MED ORDER — VENLAFAXINE HCL ER 75 MG PO CP24: 75.0000 mg | ORAL_CAPSULE | Freq: Every day | ORAL | Status: DC

## 2013-06-03 NOTE — Addendum Note (Signed)
Addended by: Terrance Mass on: 06/03/2013 03:31 PM   Modules accepted: Orders

## 2013-06-03 NOTE — Progress Notes (Signed)
   Patient presented to the office today for followup ultrasound. Patient was seen in the office on 05/29/2013 she not been seen in the office in 2004.She's been complaining of fatigue, weight gain hot flashes irritability mood swings and decreased libido. Patient's mother with history of breast cancer.She did state that several months ago she did notice some blood in her stool in her father had been diagnosed with colon polyps in the 76s. Patient does smoke a pack of cigarettes per day. She stated that the GYN clinic at [facility] she had a normal Pap smear September 2014. She had a normal mammogram September 2014 also. Patient 2012 had a left benign breast biopsy. On that office visit she was treated for bacterial vaginosis. The patient reported not having had a menstrual cycle since 2012 and had complained of occasional hot flashes but instability mood swings and insomnia along with abdominal bloating was her only complaint.  She brought with her today the fecal Hemoccult cards to be tested. She will make an appointment with her gastroenterologist for colonoscopy due to family history of colon polyps and several episodes of hematochezia and abdominal bloating. Her recent TSH and prolactin were normal but her Dresser was found to be elevated 93.8 in the menopausal range.  Urine pregnancy today was negative. Ultrasound: Uterus measured 8.9 x 5.5 x 3.2 cm with endometrial stripe at 2.5 mm. Right ovary was normal. Left ovary with adjacent thin-walled cyst measuring 5.0 x 5.2 x 4.3 cm with average size of 4.8 cm a thick septum cystic area echo free mass avascular was noted. Arterial blood flow was seen to the left ovary. There was no fluid in the cul-de-sac.  Assessment/plan: #1 left ovarian cyst appears benign but we will do a CA 125 today. Patient will receive a shot of Depo-Provera 150 mg IM and return to the office in 3 months for followup ultrasound. #2 because of her one pack of cigarettes per day  smoking history and her vasomotor symptoms are not that regular we are going to start her on Wellbutrin 150 mg daily extended release. #3 patient will follow up with gastroenterologist for colonoscopy.

## 2013-06-03 NOTE — Addendum Note (Signed)
Addended by: Su Grand A on: 06/03/2013 03:33 PM   Modules accepted: Orders

## 2013-06-03 NOTE — Patient Instructions (Addendum)
Bupropion extended-release tablets (Depression/Mood Disorders) What is this medicine? BUPROPION (byoo PROE pee on) is used to treat depression. This medicine may be used for other purposes; ask your health care provider or pharmacist if you have questions. COMMON BRAND NAME(S): Aplenzin, Budeprion XL , Forfivo XL, Wellbutrin XL What should I tell my health care provider before I take this medicine? They need to know if you have any of these conditions: -an eating disorder, such as anorexia or bulimia -bipolar disorder or psychosis -diabetes or high blood sugar, treated with medication -glaucoma -head injury or brain tumor -heart disease, previous heart attack, or irregular heart beat -high blood pressure -kidney or liver disease -seizures (convulsions) -suicidal thoughts or a previous suicide attempt -Tourette's syndrome -weight loss -an unusual or allergic reaction to bupropion, other medicines, foods, dyes, or preservatives -breast-feeding -pregnant or trying to become pregnant How should I use this medicine? Take this medicine by mouth with a glass of water. Follow the directions on the prescription label. You can take it with or without food. If it upsets your stomach, take it with food. Do not crush, chew, or cut these tablets. This medicine is taken once daily at the same time each day. Do not take your medicine more often than directed. Do not stop taking this medicine suddenly except upon the advice of your doctor. Stopping this medicine too quickly may cause serious side effects or your condition may worsen. A special MedGuide will be given to you by the pharmacist with each prescription and refill. Be sure to read this information carefully each time. Talk to your pediatrician regarding the use of this medicine in children. Special care may be needed. Overdosage: If you think you have taken too much of this medicine contact a poison control center or emergency room at once. NOTE:  This medicine is only for you. Do not share this medicine with others. What if I miss a dose? If you miss a dose, skip the missed dose and take your next tablet at the regular time. Do not take double or extra doses. What may interact with this medicine? Do not take this medicine with any of the following medications: -linezolid -MAOIs like Azilect, Carbex, Eldepryl, Marplan, Nardil, and Parnate -methylene blue (injected into a vein) -other medicines that contain bupropion like Zyban This medicine may also interact with the following medications: -alcohol -certain medicines for anxiety or sleep -certain medicines for blood pressure like metoprolol, propranolol -certain medicines for depression or psychotic disturbances -certain medicines for HIV or AIDS like efavirenz, lopinavir, nelfinavir, ritonavir -certain medicines for irregular heart beat like propafenone, flecainide -certain medicines for Parkinson's disease like amantadine, levodopa -certain medicines for seizures like carbamazepine, phenytoin, phenobarbital -cimetidine -clopidogrel -cyclophosphamide -furazolidone -isoniazid -nicotine -orphenadrine -procarbazine -steroid medicines like prednisone or cortisone -stimulant medicines for attention disorders, weight loss, or to stay awake -tamoxifen -theophylline -thiotepa -ticlopidine -tramadol -warfarin This list may not describe all possible interactions. Give your health care provider a list of all the medicines, herbs, non-prescription drugs, or dietary supplements you use. Also tell them if you smoke, drink alcohol, or use illegal drugs. Some items may interact with your medicine. What should I watch for while using this medicine? Tell your doctor if your symptoms do not get better or if they get worse. Visit your doctor or health care professional for regular checks on your progress. Because it may take several weeks to see the full effects of this medicine, it is  important to continue your treatment as  prescribed by your doctor. Patients and their families should watch out for new or worsening thoughts of suicide or depression. Also watch out for sudden changes in feelings such as feeling anxious, agitated, panicky, irritable, hostile, aggressive, impulsive, severely restless, overly excited and hyperactive, or not being able to sleep. If this happens, especially at the beginning of treatment or after a change in dose, call your health care professional. Avoid alcoholic drinks while taking this medicine. Drinking large amounts of alcoholic beverages, using sleeping or anxiety medicines, or quickly stopping the use of these agents while taking this medicine may increase your risk for a seizure. Do not drive or use heavy machinery until you know how this medicine affects you. This medicine can impair your ability to perform these tasks. Do not take this medicine close to bedtime. It may prevent you from sleeping. Your mouth may get dry. Chewing sugarless gum or sucking hard candy, and drinking plenty of water may help. Contact your doctor if the problem does not go away or is severe. The tablet shell for some brands of this medicine does not dissolve. This is normal. The tablet shell may appear whole in the stool. This is not a cause for concern. What side effects may I notice from receiving this medicine? Side effects that you should report to your doctor or health care professional as soon as possible: -allergic reactions like skin rash, itching or hives, swelling of the face, lips, or tongue -breathing problems -changes in vision -confusion -fast or irregular heartbeat -hallucinations -increased blood pressure -redness, blistering, peeling or loosening of the skin, including inside the mouth -seizures -suicidal thoughts or other mood changes -unusually weak or tired -vomiting Side effects that usually do not require medical attention (report to your  doctor or health care professional if they continue or are bothersome): -change in sex drive or performance -constipation -headache -loss of appetite -nausea -tremors -weight loss This list may not describe all possible side effects. Call your doctor for medical advice about side effects. You may report side effects to FDA at 1-800-FDA-1088. Where should I keep my medicine? Keep out of the reach of children. Store at room temperature between 15 and 30 degrees C (59 and 86 degrees F). Throw away any unused medicine after the expiration date. NOTE: This sheet is a summary. It may not cover all possible information. If you have questions about this medicine, talk to your doctor, pharmacist, or health care provider.  2014, Elsevier/Gold Standard. (2012-08-22 12:39:42) Smoking Cessation Quitting smoking is important to your health and has many advantages. However, it is not always easy to quit since nicotine is a very addictive drug. Often times, people try 3 times or more before being able to quit. This document explains the best ways for you to prepare to quit smoking. Quitting takes hard work and a lot of effort, but you can do it. ADVANTAGES OF QUITTING SMOKING  You will live longer, feel better, and live better.  Your body will feel the impact of quitting smoking almost immediately.  Within 20 minutes, blood pressure decreases. Your pulse returns to its normal level.  After 8 hours, carbon monoxide levels in the blood return to normal. Your oxygen level increases.  After 24 hours, the chance of having a heart attack starts to decrease. Your breath, hair, and body stop smelling like smoke.  After 48 hours, damaged nerve endings begin to recover. Your sense of taste and smell improve.  After 72 hours, the body is virtually  free of nicotine. Your bronchial tubes relax and breathing becomes easier.  After 2 to 12 weeks, lungs can hold more air. Exercise becomes easier and circulation  improves.  The risk of having a heart attack, stroke, cancer, or lung disease is greatly reduced.  After 1 year, the risk of coronary heart disease is cut in half.  After 5 years, the risk of stroke falls to the same as a nonsmoker.  After 10 years, the risk of lung cancer is cut in half and the risk of other cancers decreases significantly.  After 15 years, the risk of coronary heart disease drops, usually to the level of a nonsmoker.  If you are pregnant, quitting smoking will improve your chances of having a healthy baby.  The people you live with, especially any children, will be healthier.  You will have extra money to spend on things other than cigarettes. QUESTIONS TO THINK ABOUT BEFORE ATTEMPTING TO QUIT You may want to talk about your answers with your caregiver.  Why do you want to quit?  If you tried to quit in the past, what helped and what did not?  What will be the most difficult situations for you after you quit? How will you plan to handle them?  Who can help you through the tough times? Your family? Friends? A caregiver?  What pleasures do you get from smoking? What ways can you still get pleasure if you quit? Here are some questions to ask your caregiver:  How can you help me to be successful at quitting?  What medicine do you think would be best for me and how should I take it?  What should I do if I need more help?  What is smoking withdrawal like? How can I get information on withdrawal? GET READY  Set a quit date.  Change your environment by getting rid of all cigarettes, ashtrays, matches, and lighters in your home, car, or work. Do not let people smoke in your home.  Review your past attempts to quit. Think about what worked and what did not. GET SUPPORT AND ENCOURAGEMENT You have a better chance of being successful if you have help. You can get support in many ways.  Tell your family, friends, and co-workers that you are going to quit and need  their support. Ask them not to smoke around you.  Get individual, group, or telephone counseling and support. Programs are available at [employer] and health centers. Call your local health department for information about programs in your area.  Spiritual beliefs and practices may help some smokers quit.  Download a "quit meter" on your computer to keep track of quit statistics, such as how long you have gone without smoking, cigarettes not smoked, and money saved.  Get a self-help book about quitting smoking and staying off of tobacco. Irion yourself from urges to smoke. Talk to someone, go for a walk, or occupy your time with a task.  Change your normal routine. Take a different route to work. Drink tea instead of coffee. Eat breakfast in a different place.  Reduce your stress. Take a hot bath, exercise, or read a book.  Plan something enjoyable to do every day. Reward yourself for not smoking.  Explore interactive web-based programs that specialize in helping you quit. GET MEDICINE AND USE IT CORRECTLY Medicines can help you stop smoking and decrease the urge to smoke. Combining medicine with the above behavioral methods and support can greatly  increase your chances of successfully quitting smoking.  Nicotine replacement therapy helps deliver nicotine to your body without the negative effects and risks of smoking. Nicotine replacement therapy includes nicotine gum, lozenges, inhalers, nasal sprays, and skin patches. Some may be available over-the-counter and others require a prescription.  Antidepressant medicine helps people abstain from smoking, but how this works is unknown. This medicine is available by prescription.  Nicotinic receptor partial agonist medicine simulates the effect of nicotine in your brain. This medicine is available by prescription. Ask your caregiver for advice about which medicines to use and how to use them based on  your health history. Your caregiver will tell you what side effects to look out for if you choose to be on a medicine or therapy. Carefully read the information on the package. Do not use any other product containing nicotine while using a nicotine replacement product.  RELAPSE OR DIFFICULT SITUATIONS Most relapses occur within the first 3 months after quitting. Do not be discouraged if you start smoking again. Remember, most people try several times before finally quitting. You may have symptoms of withdrawal because your body is used to nicotine. You may crave cigarettes, be irritable, feel very hungry, cough often, get headaches, or have difficulty concentrating. The withdrawal symptoms are only temporary. They are strongest when you first quit, but they will go away within 10 14 days. To reduce the chances of relapse, try to:  Avoid drinking alcohol. Drinking lowers your chances of successfully quitting.  Reduce the amount of caffeine you consume. Once you quit smoking, the amount of caffeine in your body increases and can give you symptoms, such as a rapid heartbeat, sweating, and anxiety.  Avoid smokers because they can make you want to smoke.  Do not let weight gain distract you. Many smokers will gain weight when they quit, usually less than 10 pounds. Eat a healthy diet and stay active. You can always lose the weight gained after you quit.  Find ways to improve your mood other than smoking. FOR MORE INFORMATION  www.smokefree.gov  Document Released: 01/23/2001 Document Revised: 07/31/2011 Document Reviewed: 05/10/2011 [facility] Patient Information 2014 [place], [place]. CA-125 Tumor Marker CA 125 is a tumor marker that is used to help monitor the course of ovarian or endometrial cancer. PREPARATION FOR TEST No preparation is necessary. NORMAL FINDINGS Adults: 0-35 units/mL (0-35 kilounits)/L Ranges for normal findings may vary among different laboratories and hospitals. You should  always check with your doctor after having lab work or other tests done to discuss the meaning of your test results and whether your values are considered within normal limits. MEANING OF TEST  Your caregiver will go over the test results with you and discuss the importance and meaning of your results, as well as treatment options and the need for additional tests if necessary. OBTAINING THE TEST RESULTS It is your responsibility to obtain your test results. Ask the lab or department performing the test when and how you will get your results. Document Released: 02/21/2004 Document Revised: 04/23/2011 Document Reviewed: 01/07/2008 [facility] Patient Information 2014 [place], [place]. Ovarian Cyst An ovarian cyst is a fluid-filled sac that forms on an ovary. The ovaries are small organs that produce eggs in women. Various types of cysts can form on the ovaries. Most are not cancerous. Many do not cause problems, and they often go away on their own. Some may cause symptoms and require treatment. Common types of ovarian cysts include:  Functional cysts These cysts may occur every  month during the menstrual cycle. This is normal. The cysts usually go away with the next menstrual cycle if the woman does not get pregnant. Usually, there are no symptoms with a functional cyst.  Endometrioma cysts These cysts form from the tissue that lines the uterus. They are also called "chocolate cysts" because they become filled with blood that turns brown. This type of cyst can cause pain in the lower abdomen during intercourse and with your menstrual period.  Cystadenoma cysts This type develops from the cells on the outside of the ovary. These cysts can get very big and cause lower abdomen pain and pain with intercourse. This type of cyst can twist on itself, cut off its blood supply, and cause severe pain. It can also easily rupture and cause a lot of pain.  Dermoid cysts This type of cyst is sometimes found in both  ovaries. These cysts may contain different kinds of body tissue, such as skin, teeth, hair, or cartilage. They usually do not cause symptoms unless they get very big.  Theca lutein cysts These cysts occur when too much of a certain hormone (human chorionic gonadotropin) is produced and overstimulates the ovaries to produce an egg. This is most common after procedures used to assist with the conception of a baby (in vitro fertilization). CAUSES   Fertility drugs can cause a condition in which multiple large cysts are formed on the ovaries. This is called ovarian hyperstimulation syndrome.  A condition called polycystic ovary syndrome can cause hormonal imbalances that can lead to nonfunctional ovarian cysts. SIGNS AND SYMPTOMS  Many ovarian cysts do not cause symptoms. If symptoms are present, they may include:  Pelvic pain or pressure.  Pain in the lower abdomen.  Pain during sexual intercourse.  Increasing girth (swelling) of the abdomen.  Abnormal menstrual periods.  Increasing pain with menstrual periods.  Stopping having menstrual periods without being pregnant. DIAGNOSIS  These cysts are commonly found during a routine or annual pelvic exam. Tests may be ordered to find out more about the cyst. These tests may include:  Ultrasound.  X-ray of the pelvis.  CT scan.  MRI.  Blood tests. TREATMENT  Many ovarian cysts go away on their own without treatment. Your health care provider may want to check your cyst regularly for 2 3 months to see if it changes. For women in menopause, it is particularly important to monitor a cyst closely because of the higher rate of ovarian cancer in menopausal women. When treatment is needed, it may include any of the following:  A procedure to drain the cyst (aspiration). This may be done using a long needle and ultrasound. It can also be done through a laparoscopic procedure. This involves using a thin, lighted tube with a tiny camera on the end  (laparoscope) inserted through a small incision.  Surgery to remove the whole cyst. This may be done using laparoscopic surgery or an open surgery involving a larger incision in the lower abdomen.  Hormone treatment or birth control pills. These methods are sometimes used to help dissolve a cyst. HOME CARE INSTRUCTIONS   Only take over-the-counter or prescription medicines as directed by your health care provider.  Follow up with your health care provider as directed.  Get regular pelvic exams and Pap tests. SEEK MEDICAL CARE IF:   Your periods are late, irregular, or painful, or they stop.  Your pelvic pain or abdominal pain does not go away.  Your abdomen becomes larger or swollen.  You  have pressure on your bladder or trouble emptying your bladder completely.  You have pain during sexual intercourse.  You have feelings of fullness, pressure, or discomfort in your stomach.  You lose weight for no apparent reason.  You feel generally ill.  You become constipated.  You lose your appetite.  You develop acne.  You have an increase in body and facial hair.  You are gaining weight, without changing your exercise and eating habits.  You think you are pregnant. SEEK IMMEDIATE MEDICAL CARE IF:   You have increasing abdominal pain.  You feel sick to your stomach (nauseous), and you throw up (vomit).  You develop a fever that comes on suddenly.  You have abdominal pain during a bowel movement.  Your menstrual periods become heavier than usual. Document Released: 01/29/2005 Document Revised: 11/19/2012 Document Reviewed: 10/06/2012 [facility] Patient Information 2014 [place].

## 2013-06-04 LAB — CA 125: CA 125: 3.5 U/mL (ref 0.0–30.2)

## 2013-06-08 ENCOUNTER — Telehealth: Payer: Self-pay | Admitting: *Deleted

## 2013-06-08 NOTE — Telephone Encounter (Signed)
Pt informed with the below note, transferred to front desk to schedule. 

## 2013-06-08 NOTE — Telephone Encounter (Signed)
The three months would give sufficient time for the depoprovera to be effective. If she wants to come back in two months is fine but may need another ultrasound if not completely resolved.

## 2013-06-08 NOTE — Telephone Encounter (Signed)
Pt was informed CA-125 test with in normal range, scheduled for recheck ultrasound in July. Pt asked if u/s could be done at 2 months rather than 3 months? Pt was reading some information online regarding CA-125 test and now c/o dull aching in back. Please advise

## 2013-06-15 ENCOUNTER — Telehealth: Payer: Self-pay | Admitting: *Deleted

## 2013-06-15 NOTE — Telephone Encounter (Signed)
Pt said she never received AVS, this was mailed to patient today.

## 2013-06-23 ENCOUNTER — Other Ambulatory Visit: Payer: BC Managed Care – PPO | Admitting: Anesthesiology

## 2013-06-23 DIAGNOSIS — Z1211 Encounter for screening for malignant neoplasm of colon: Secondary | ICD-10-CM

## 2013-07-07 ENCOUNTER — Ambulatory Visit (INDEPENDENT_AMBULATORY_CARE_PROVIDER_SITE_OTHER): Payer: BC Managed Care – PPO | Admitting: Women's Health

## 2013-07-07 ENCOUNTER — Encounter: Payer: Self-pay | Admitting: Women's Health

## 2013-07-07 DIAGNOSIS — N83209 Unspecified ovarian cyst, unspecified side: Secondary | ICD-10-CM

## 2013-07-07 DIAGNOSIS — B373 Candidiasis of vulva and vagina: Secondary | ICD-10-CM

## 2013-07-07 DIAGNOSIS — B3731 Acute candidiasis of vulva and vagina: Secondary | ICD-10-CM

## 2013-07-07 LAB — WET PREP FOR TRICH, YEAST, CLUE
CLUE CELLS WET PREP: NONE SEEN
TRICH WET PREP: NONE SEEN

## 2013-07-07 MED ORDER — FLUCONAZOLE 150 MG PO TABS: 150.0000 mg | ORAL_TABLET | Freq: Once | ORAL | Status: DC

## 2013-07-07 NOTE — Progress Notes (Signed)
Patient ID: Melanie Owens, female   DOB: 1970-12-11, 43 y.o.   MRN: [id number] Presents with complaint of dark brown discharge. Was noted to have a 5 cm  left ovarian cyst 05/2013. Treated with Depo-Provera. Wasta 89, amenorrheic since 2012, Ca 125 3.5. Smoker. Same partner. Denies vaginal itching or odor, abdominal pain or fever.  Exam: Appears anxious, external genitalia within normal limits, speculum exam scant brown discharge, wet prep positive for yeast. Bimanual no CMT slight tenderness on the left adnexa with some fullness.  Yeast vaginitis Left ovarian cyst Spotting after Depo-Provera  Plan: Diflucan 150 mg  one dose, yeast prevention discussed. Keep scheduled followup ultrasound appointment. Reassurance, reviewed spotting is common after Depo-Provera.

## 2013-07-07 NOTE — Patient Instructions (Signed)

## 2013-08-05 ENCOUNTER — Ambulatory Visit (INDEPENDENT_AMBULATORY_CARE_PROVIDER_SITE_OTHER): Payer: BC Managed Care – PPO | Admitting: Gynecology

## 2013-08-05 ENCOUNTER — Other Ambulatory Visit: Payer: Self-pay | Admitting: Gynecology

## 2013-08-05 ENCOUNTER — Ambulatory Visit (INDEPENDENT_AMBULATORY_CARE_PROVIDER_SITE_OTHER): Payer: BC Managed Care – PPO

## 2013-08-05 ENCOUNTER — Encounter: Payer: Self-pay | Admitting: Gynecology

## 2013-08-05 VITALS — BP 130/78

## 2013-08-05 DIAGNOSIS — R143 Flatulence: Secondary | ICD-10-CM

## 2013-08-05 DIAGNOSIS — N7013 Chronic salpingitis and oophoritis: Secondary | ICD-10-CM

## 2013-08-05 DIAGNOSIS — D3910 Neoplasm of uncertain behavior of unspecified ovary: Secondary | ICD-10-CM

## 2013-08-05 DIAGNOSIS — R142 Eructation: Secondary | ICD-10-CM

## 2013-08-05 DIAGNOSIS — R1900 Intra-abdominal and pelvic swelling, mass and lump, unspecified site: Secondary | ICD-10-CM

## 2013-08-05 DIAGNOSIS — E288 Other ovarian dysfunction: Secondary | ICD-10-CM

## 2013-08-05 DIAGNOSIS — D3912 Neoplasm of uncertain behavior of left ovary: Secondary | ICD-10-CM

## 2013-08-05 DIAGNOSIS — R140 Abdominal distension (gaseous): Secondary | ICD-10-CM

## 2013-08-05 DIAGNOSIS — N83202 Unspecified ovarian cyst, left side: Secondary | ICD-10-CM

## 2013-08-05 DIAGNOSIS — E2839 Other primary ovarian failure: Secondary | ICD-10-CM | POA: Insufficient documentation

## 2013-08-05 DIAGNOSIS — R141 Gas pain: Secondary | ICD-10-CM

## 2013-08-05 DIAGNOSIS — N7011 Chronic salpingitis: Secondary | ICD-10-CM

## 2013-08-05 DIAGNOSIS — Z8371 Family history of colonic polyps: Secondary | ICD-10-CM

## 2013-08-05 DIAGNOSIS — N83209 Unspecified ovarian cyst, unspecified side: Secondary | ICD-10-CM

## 2013-08-05 NOTE — Progress Notes (Signed)
   Patient presented to the office today for followup almost perceived to be a left ovarian cyst versus hydrosalpinx. Review of her record indicated the following: She had submitted of fecal Hemoccult cards to the office and was negative. Patient continues to smoke one pack of cigarettes per day. Patient reported not having a menstrual cycle since 2012. She had complained of occasional hot flashes and mood swings and insomnia along with abdominal bloating and this is the reason that the ultrasound had been ordered.  The ultrasound on April 22 demonstrated the following: Uterus measured 8.9 x 5.5 x 3.2 cm with endometrial stripe at 2.5 mm. Right ovary was normal. Left ovary with adjacent thin-walled cyst measuring 5.0 x 5.2 x 4.3 cm with average size of 4.8 cm a thick septum cystic area echo free mass avascular was noted. Arterial blood flow was seen to the left ovary. There was no fluid in the cul-de-sac.  Patient had a negative pregnancy test. She had a normal TSH and prolactin. Her [place] was found to be elevated at 93.8. She had a normal CA 125 with a value of 3.5.  Patient now reports no vasomotor symptoms. She had been given a shot of Depo-Provera 150 mg and was instructed to return back in 3 months for followup but she was anxious to return today to much later. Ultrasound today demonstrated the following:  Uterus measured 8.4 x 5.5 x 3.4 cm with endometrial stripe of 5.1 mm. Right ovary was normal. Left ovary thin-walled coma shaped cystic mass measuring 5.2 x 4.3 x 4.4 cm average size 4.6 cm with thick septum and several thin septations mass was avascular other than septum shows vascular flow. No fluid in the cul-de-sac.  Assessment/plan: Persistent left adnexal mass appears to be a hydrosalpinx. Patient anxious. Normal CA 125 2  months ago. Will repeat a C1 25 today. We'll schedule for laparoscopic left cystectomy possible left salpingectomy possible left salpingo-oophorectomy. 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 Also since patient smokes a pack of cigarettes per day he would not be safe for her to be started on low dose oral contraceptive pill at the age of 89. We discussed once again the importance of discontinuation of her smoking habit. She will return back to the office a week before surgery for preop consultation in full exam. Literature and information was provided on ovarian cysts and a laparoscopy. Patient once again was reminded to schedule her colonoscopy because of the father's family history of colon polyps.

## 2013-08-05 NOTE — Patient Instructions (Signed)
Perimenopause Perimenopause is the time when your body begins to move into the menopause (no menstrual period for 12 straight months). It is a natural process. Perimenopause can begin 2-8 years before the menopause and usually lasts for 1 year after the menopause. During this time, your ovaries may or may not produce an egg. The ovaries vary in their production of estrogen and progesterone hormones each month. This can cause irregular menstrual periods, difficulty getting pregnant, vaginal bleeding between periods, and uncomfortable symptoms. CAUSES  Irregular production of the ovarian hormones, estrogen and progesterone, and not ovulating every month.  Other causes include:  Tumor of the pituitary gland in the brain.  Medical disease that affects the ovaries.  Radiation treatment.  Chemotherapy.  Unknown causes.  Heavy smoking and excessive alcohol intake can bring on perimenopause sooner. SIGNS AND SYMPTOMS   Hot flashes.  Night sweats.  Irregular menstrual periods.  Decreased sex drive.  Vaginal dryness.  Headaches.  Mood swings.  Depression.  Memory problems.  Irritability.  Tiredness.  Weight gain.  Trouble getting pregnant.  The beginning of losing bone cells (osteoporosis).  The beginning of hardening of the arteries (atherosclerosis). DIAGNOSIS  Your health care Finnean Cerami will make a diagnosis by analyzing your age, menstrual history, and symptoms. He or she will do a physical exam and note any changes in your body, especially your female organs. Female hormone tests may or may not be helpful depending on the amount of female hormones you produce and when you produce them. However, other hormone tests may be helpful to rule out other problems. TREATMENT  In some cases, no treatment is needed. The decision on whether treatment is necessary during the perimenopause should be made by you and your health care Consandra Laske based on how the symptoms are affecting you  and your lifestyle. Various treatments are available, such as:  Treating individual symptoms with a specific medicine for that symptom.  Herbal medicines that can help specific symptoms.  Counseling.  Group therapy. HOME CARE INSTRUCTIONS   Keep track of your menstrual periods (when they occur, how heavy they are, how long between periods, and how long they last) as well as your symptoms and when they started.  Only take over-the-counter or prescription medicines as directed by your health care Maeby Vankleeck.  Sleep and rest.  Exercise.  Eat a diet that contains calcium (good for your bones) and soy (acts like the estrogen hormone).  Do not smoke.  Avoid alcoholic beverages.  Take vitamin supplements as recommended by your health care Magdaleno Lortie. Taking vitamin E may help in certain cases.  Take calcium and vitamin D supplements to help prevent bone loss.  Group therapy is sometimes helpful.  Acupuncture may help in some cases. SEEK MEDICAL CARE IF:   You have questions about any symptoms you are having.  You need a referral to a specialist (gynecologist, psychiatrist, or psychologist). SEEK IMMEDIATE MEDICAL CARE IF:   You have vaginal bleeding.  Your period lasts longer than 8 days.  Your periods are recurring sooner than 21 days.  You have bleeding after intercourse.  You have severe depression.  You have pain when you urinate.  You have severe headaches.  You have vision problems. Document Released: 03/08/2004 Document Revised: 11/19/2012 Document Reviewed: 08/28/2012 [facility] Patient Information 2015 [place], [place]. This information is not intended to replace advice given to you by your health care Ethelmae Ringel. Make sure you discuss any questions you have with your health care Daneen Volcy. Diagnostic Laparoscopy Laparoscopy is a  surgical procedure. It is used to diagnose and treat diseases inside the belly (abdomen). It is usually a brief, common, and relatively  simple procedure. The laparoscopeis a thin, lighted, pencil-sized instrument. It is like a telescope. It is inserted into your abdomen through a small cut (incision). Your caregiver can look at the organs inside your body through this instrument. He or she can see if there is anything abnormal. Laparoscopy can be done either in a hospital or outpatient clinic. You may be given a mild sedative to help you relax before the procedure. Once in the operating room, you will be given a drug to make you sleep (general anesthesia). Laparoscopy usually lasts less than 1 hour. After the procedure, you will be monitored in a recovery area until you are stable and doing well. Once you are home, it will take 2 to 3 days to fully recover. RISKS AND COMPLICATIONS  Laparoscopy has relatively few risks. Your caregiver will discuss the risks with you before the procedure. Some problems that can occur include:  Infection.  Bleeding.  Damage to other organs.  Anesthetic side effects. PROCEDURE Once you receive anesthesia, your surgeon inflates the abdomen with a harmless gas (carbon dioxide). This makes the organs easier to see. The laparoscope is inserted into the abdomen through a small incision. This allows your surgeon to see into the abdomen. Other small instruments are also inserted into the abdomen through other small openings. Many surgeons attach a video camera to the laparoscope to enlarge the view. During a diagnostic laparoscopy, the surgeon may be looking for inflammation, infection, or cancer. Your surgeon may take tissue samples(biopsies). The samples are sent to a specialist in looking at cells and tissue samples (pathologist). The pathologist examines them under a microscope. Biopsies can help to diagnose or confirm a disease. AFTER THE PROCEDURE   The gas is released from inside the abdomen.  The incisions are closed with stitches (sutures). Because these incisions are small (usually less than 1/2  inch), there is usually minimal discomfort after the procedure. There may be some mild discomfort in the throat. This is from the tube placed in the throat while you were sleeping. You may have some mild abdominal discomfort. There may also be discomfort from the instrument placement incisions in the abdomen.  The recovery time is shortened as long as there are no complications.  You will rest in a recovery room until stable and doing well. As long as there are no complications, you may be allowed to go home. FINDING OUT THE RESULTS OF YOUR TEST Not all test results are available during your visit. If your test results are not back during the visit, make an appointment with your caregiver to find out the results. Do not assume everything is normal if you have not heard from your caregiver or the medical facility. It is important for you to follow up on all of your test results. HOME CARE INSTRUCTIONS   Take all medicines as directed.  Only take over-the-counter or prescription medicines for pain, discomfort, or fever as directed by your caregiver.  Resume daily activities as directed.  Showers are preferred over baths.  You may resume sexual activities in 1 week or as directed.  Do not drive while taking narcotics. SEEK MEDICAL CARE IF:   There is increasing abdominal pain.  There is new pain in the shoulders (shoulder strap areas).  You feel lightheaded or faint.  You have the chills.  You or your child has an  oral temperature above 102 F (38.9 C).  There is pus-like (purulent) drainage from any of the wounds.  You are unable to pass gas or have a bowel movement.  You feel sick to your stomach (nauseous) or throw up (vomit). MAKE SURE YOU:   Understand these instructions.  Will watch your condition.  Will get help right away if you are not doing well or get worse. Document Released: 05/07/2000 Document Revised: 05/26/2012 Document Reviewed: 01/29/2007 [facility] Patient  Information 2015 [place], [place]. This information is not intended to replace advice given to you by your health care Maybell Misenheimer. Make sure you discuss any questions you have with your health care Myesha Stillion.

## 2013-08-06 ENCOUNTER — Telehealth: Payer: Self-pay

## 2013-08-06 NOTE — Telephone Encounter (Signed)
I called patient to discuss scheduling Lap Bilat Salpingectomy.  We discussed her insurance benefits and her estimated financial responsibility.  Patient wants to consider this and dates and will call me back when ready to schedule.

## 2013-08-07 ENCOUNTER — Telehealth: Payer: Self-pay

## 2013-08-07 NOTE — Telephone Encounter (Signed)
Patient had called stating she needed cost of surgery.  We had discussed this in detail yesterday but I called her and repeated the estimated costs and her ins benefits to her again.  She proceeded to tell me she had many questions and began to tell me some of them as I was advising her she needs to schedule appointment our call was disconnected.  I called her back three times over the next ten minutes but it would ring a few times and disconnect. Patient did not call me back either so I am assuming her phone line is having problems.  I will wait to hear back from her.

## 2013-08-26 ENCOUNTER — Telehealth: Payer: Self-pay | Admitting: *Deleted

## 2013-08-26 NOTE — Telephone Encounter (Signed)
She will need to taper off: one every other day for 1 week then second week three times a week then come off. Meanwhile she will need to buy OTC Nicoderm patch or Gum. There are Acupuncturist that help with smoking vice and have had good results. She will have to call around.

## 2013-08-26 NOTE — Telephone Encounter (Signed)
Pt informed with the below note. 

## 2013-08-26 NOTE — Telephone Encounter (Signed)
Pt currently taking Wellbutrin Xl 150 to help with smoking, pt said its doesn't seem to be working. Pt c/o weight gain and still smoking she did state very nervous about upcoming surgery. Please advise

## 2013-09-02 ENCOUNTER — Ambulatory Visit: Payer: BC Managed Care – PPO | Admitting: Gynecology

## 2013-09-02 ENCOUNTER — Other Ambulatory Visit: Payer: BC Managed Care – PPO

## 2013-09-15 ENCOUNTER — Encounter: Payer: Self-pay | Admitting: Internal Medicine

## 2013-09-15 ENCOUNTER — Encounter (HOSPITAL_COMMUNITY): Payer: Self-pay | Admitting: Pharmacist

## 2013-09-16 ENCOUNTER — Encounter (HOSPITAL_COMMUNITY): Payer: Self-pay | Admitting: *Deleted

## 2013-09-18 ENCOUNTER — Encounter: Payer: Self-pay | Admitting: *Deleted

## 2013-09-22 ENCOUNTER — Ambulatory Visit (INDEPENDENT_AMBULATORY_CARE_PROVIDER_SITE_OTHER): Payer: BC Managed Care – PPO | Admitting: Gynecology

## 2013-09-22 ENCOUNTER — Encounter: Payer: Self-pay | Admitting: Gynecology

## 2013-09-22 VITALS — BP 126/78

## 2013-09-22 DIAGNOSIS — Z01818 Encounter for other preprocedural examination: Secondary | ICD-10-CM

## 2013-09-22 DIAGNOSIS — Z23 Encounter for immunization: Secondary | ICD-10-CM

## 2013-09-22 DIAGNOSIS — R1900 Intra-abdominal and pelvic swelling, mass and lump, unspecified site: Secondary | ICD-10-CM

## 2013-09-22 LAB — URINALYSIS W MICROSCOPIC + REFLEX CULTURE
GLUCOSE, UA: NEGATIVE mg/dL
Hgb urine dipstick: NEGATIVE
Leukocytes, UA: NEGATIVE
Nitrite: NEGATIVE
Urobilinogen, UA: 0.2 mg/dL (ref 0.0–1.0)
pH: 5 (ref 5.0–8.0)

## 2013-09-22 MED ORDER — OXYCODONE-ACETAMINOPHEN 7.5-325 MG PO TABS: 1.0000 | ORAL_TABLET | ORAL | Status: DC | PRN

## 2013-09-22 MED ORDER — PROMETHAZINE HCL 12.5 MG PO TABS: 12.5000 mg | ORAL_TABLET | Freq: Four times a day (QID) | ORAL | Status: DC | PRN

## 2013-09-22 NOTE — Patient Instructions (Signed)
Ovarian Cyst An ovarian cyst is a fluid-filled sac that forms on an ovary. The ovaries are small organs that produce eggs in women. Various types of cysts can form on the ovaries. Most are not cancerous. Many do not cause problems, and they often go away on their own. Some may cause symptoms and require treatment. Common types of ovarian cysts include:  Functional cysts--These cysts may occur every month during the menstrual cycle. This is normal. The cysts usually go away with the next menstrual cycle if the woman does not get pregnant. Usually, there are no symptoms with a functional cyst.  Endometrioma cysts--These cysts form from the tissue that lines the uterus. They are also called "chocolate cysts" because they become filled with blood that turns brown. This type of cyst can cause pain in the lower abdomen during intercourse and with your menstrual period.  Cystadenoma cysts--This type develops from the cells on the outside of the ovary. These cysts can get very big and cause lower abdomen pain and pain with intercourse. This type of cyst can twist on itself, cut off its blood supply, and cause severe pain. It can also easily rupture and cause a lot of pain.  Dermoid cysts--This type of cyst is sometimes found in both ovaries. These cysts may contain different kinds of body tissue, such as skin, teeth, hair, or cartilage. They usually do not cause symptoms unless they get very big.  Theca lutein cysts--These cysts occur when too much of a certain hormone (human chorionic gonadotropin) is produced and overstimulates the ovaries to produce an egg. This is most common after procedures used to assist with the conception of a baby (in vitro fertilization). CAUSES   Fertility drugs can cause a condition in which multiple large cysts are formed on the ovaries. This is called ovarian hyperstimulation syndrome.  A condition called polycystic ovary syndrome can cause hormonal imbalances that can lead to  nonfunctional ovarian cysts. SIGNS AND SYMPTOMS  Many ovarian cysts do not cause symptoms. If symptoms are present, they may include:  Pelvic pain or pressure.  Pain in the lower abdomen.  Pain during sexual intercourse.  Increasing girth (swelling) of the abdomen.  Abnormal menstrual periods.  Increasing pain with menstrual periods.  Stopping having menstrual periods without being pregnant. DIAGNOSIS  These cysts are commonly found during a routine or annual pelvic exam. Tests may be ordered to find out more about the cyst. These tests may include:  Ultrasound.  X-ray of the pelvis.  CT scan.  MRI.  Blood tests. TREATMENT  Many ovarian cysts go away on their own without treatment. Your health care provider may want to check your cyst regularly for 2-3 months to see if it changes. For women in menopause, it is particularly important to monitor a cyst closely because of the higher rate of ovarian cancer in menopausal women. When treatment is needed, it may include any of the following:  A procedure to drain the cyst (aspiration). This may be done using a long needle and ultrasound. It can also be done through a laparoscopic procedure. This involves using a thin, lighted tube with a tiny camera on the end (laparoscope) inserted through a small incision.  Surgery to remove the whole cyst. This may be done using laparoscopic surgery or an open surgery involving a larger incision in the lower abdomen.  Hormone treatment or birth control pills. These methods are sometimes used to help dissolve a cyst. HOME CARE INSTRUCTIONS   Only take over-the-counter   or prescription medicines as directed by your health care provider.  Follow up with your health care provider as directed.  Get regular pelvic exams and Pap tests. SEEK MEDICAL CARE IF:   Your periods are late, irregular, or painful, or they stop.  Your pelvic pain or abdominal pain does not go away.  Your abdomen becomes  larger or swollen.  You have pressure on your bladder or trouble emptying your bladder completely.  You have pain during sexual intercourse.  You have feelings of fullness, pressure, or discomfort in your stomach.  You lose weight for no apparent reason.  You feel generally ill.  You become constipated.  You lose your appetite.  You develop acne.  You have an increase in body and facial hair.  You are gaining weight, without changing your exercise and eating habits.  You think you are pregnant. SEEK IMMEDIATE MEDICAL CARE IF:   You have increasing abdominal pain.  You feel sick to your stomach (nauseous), and you throw up (vomit).  You develop a fever that comes on suddenly.  You have abdominal pain during a bowel movement.  Your menstrual periods become heavier than usual. MAKE SURE YOU:  Understand these instructions.  Will watch your condition.  Will get help right away if you are not doing well or get worse. Document Released: 01/29/2005 Document Revised: 02/03/2013 Document Reviewed: 10/06/2012 [employer] Patient Information 2015 [employer]. This information is not intended to replace advice given to you by your health care provider. Make sure you discuss any questions you have with your health care provider. Diagnostic Laparoscopy Laparoscopy is a surgical procedure. It is used to diagnose and treat diseases inside the belly (abdomen). It is usually a brief, common, and relatively simple procedure. The laparoscopeis a thin, lighted, pencil-sized instrument. It is like a telescope. It is inserted into your abdomen through a small cut (incision). Your caregiver can look at the organs inside your body through this instrument. He or she can see if there is anything abnormal. Laparoscopy can be done either in a hospital or outpatient clinic. You may be given a mild sedative to help you relax before the procedure. Once in the operating room, you will be given a drug to  make you sleep (general anesthesia). Laparoscopy usually lasts less than 1 hour. After the procedure, you will be monitored in a recovery area until you are stable and doing well. Once you are home, it will take 2 to 3 days to fully recover. RISKS AND COMPLICATIONS  Laparoscopy has relatively few risks. Your caregiver will discuss the risks with you before the procedure. Some problems that can occur include:  Infection.  Bleeding.  Damage to other organs.  Anesthetic side effects. PROCEDURE Once you receive anesthesia, your surgeon inflates the abdomen with a harmless gas (carbon dioxide). This makes the organs easier to see. The laparoscope is inserted into the abdomen through a small incision. This allows your surgeon to see into the abdomen. Other small instruments are also inserted into the abdomen through other small openings. Many surgeons attach a video camera to the laparoscope to enlarge the view. During a diagnostic laparoscopy, the surgeon may be looking for inflammation, infection, or cancer. Your surgeon may take tissue samples(biopsies). The samples are sent to a specialist in looking at cells and tissue samples (pathologist). The pathologist examines them under a microscope. Biopsies can help to diagnose or confirm a disease. AFTER THE PROCEDURE   The gas is released from inside the abdomen.    The incisions are closed with stitches (sutures). Because these incisions are small (usually less than 1/2 inch), there is usually minimal discomfort after the procedure. There may be some mild discomfort in the throat. This is from the tube placed in the throat while you were sleeping. You may have some mild abdominal discomfort. There may also be discomfort from the instrument placement incisions in the abdomen.  The recovery time is shortened as long as there are no complications.  You will rest in a recovery room until stable and doing well. As long as there are no complications, you  may be allowed to go home. FINDING OUT THE RESULTS OF YOUR TEST Not all test results are available during your visit. If your test results are not back during the visit, make an appointment with your caregiver to find out the results. Do not assume everything is normal if you have not heard from your caregiver or the medical facility. It is important for you to follow up on all of your test results. HOME CARE INSTRUCTIONS   Take all medicines as directed.  Only take over-the-counter or prescription medicines for pain, discomfort, or fever as directed by your caregiver.  Resume daily activities as directed.  Showers are preferred over baths.  You may resume sexual activities in 1 week or as directed.  Do not drive while taking narcotics. SEEK MEDICAL CARE IF:   There is increasing abdominal pain.  There is new pain in the shoulders (shoulder strap areas).  You feel lightheaded or faint.  You have the chills.  You or your child has an oral temperature above 102 F (38.9 C).  There is pus-like (purulent) drainage from any of the wounds.  You are unable to pass gas or have a bowel movement.  You feel sick to your stomach (nauseous) or throw up (vomit). MAKE SURE YOU:   Understand these instructions.  Will watch your condition.  Will get help right away if you are not doing well or get worse. Document Released: 05/07/2000 Document Revised: 05/26/2012 Document Reviewed: 01/29/2007 [employer] Patient Information 2015 [employer]. This information is not intended to replace advice given to you by your health care provider. Make sure you discuss any questions you have with your health care provider.  

## 2013-09-22 NOTE — Progress Notes (Signed)
Melanie Owens is an 43 y.o. female. Who presents to the office for preop consultation. The patient was last seen in the office on 08/05/2013 her history is as follows:  Patient presented to the office on 08/05/2013 for followup that was perceived to be a left ovarian cyst versus a left hydrosalpinx. A previous ultrasound done on April 22 demonstrated the following:  Uterus measured 8.9 x 5.5 x 3.2 cm with endometrial stripe at 2.5 mm. Right ovary was normal. Left ovary with adjacent thin-walled cyst measuring 5.0 x 5.2 x 4.3 cm with average size of 4.8 cm a thick septum cystic area echo free mass avascular was noted. Arterial blood flow was seen to the left ovary. There was no fluid in the cul-de-sac.  Patient reported not having had a menstrual cycle since 2012 and she had a normal TSH and prolactin and her [place] was in the menopausal range. She denied any vasomotor symptoms. She does smoke half pack of cigarettes per day. She was started on Effexor which is helped curtail her smoking. A CA 125 at that time was normal with a value of 3.5. Patient received a shot of Depo-Provera and then return to the office on 08/05/2013 for a follow up ultrasound which demonstrated the following:  Uterus measured 8.4 x 5.5 x 3.4 cm with endometrial stripe of 5.1 mm. Right ovary was normal. Left ovary thin-walled coma shaped cystic mass measuring 5.2 x 4.3 x 4.4 cm average size 4.6 cm with thick septum and several thin septations mass was avascular other than septum shows vascular flow. No fluid in the cul-de-sac.    the assessment and plan were as follows: Persistent left adnexal mass appears to be a hydrosalpinx. Patient anxious. Normal CA 125 2 months ago. Will repeat a C1 25 today. We'll schedule for laparoscopic left cystectomy possible left salpingectomy possible left salpingo-oophorectomy along with right salpingectomy.    Pertinent Gynecological History: Menses: post-menopausal Bleeding:  none Contraception: post menopausal status DES exposure: unknown Blood transfusions: none Sexually transmitted diseases: no past history Previous GYN Procedures: none  Last mammogram: normal Date: 2014 Last pap: normal Date: 2014 OB History: G6, P3A3   Menstrual History: Menarche age: 35  Patient's last menstrual period was 04/04/2011.    Past Medical History  Diagnosis Date  . Depression   . Premature ovarian failure     History reviewed. No pertinent past surgical history.  Family History  Problem Relation Age of Onset  . Breast cancer Mother 71  . Thyroid cancer Father 62  . Hypertension Father   . Thyroid cancer Sister 38  . Breast cancer Paternal Grandmother   . Cervical cancer Daughter 10  . Cancer Sister     79's  . Colon polyps Father     Social History:  reports that she has been smoking Cigarettes.  She has a 12.5 pack-year smoking history. She has never used smokeless tobacco. She reports that she does not drink alcohol or use illicit drugs.  Allergies: No Known Allergies   (Not in a hospital admission)  REVIEW OF SYSTEMS: A ROS was performed and pertinent positives and negatives are included in the history.  GENERAL: No fevers or chills. HEENT: No change in vision, no earache, sore throat or sinus congestion. NECK: No pain or stiffness. CARDIOVASCULAR: No chest pain or pressure. No palpitations. PULMONARY: No shortness of breath, cough or wheeze. GASTROINTESTINAL: No abdominal pain, nausea, vomiting or diarrhea, melena or bright red blood per rectum. GENITOURINARY: No urinary  frequency, urgency, hesitancy or dysuria. MUSCULOSKELETAL: No joint or muscle pain, no back pain, no recent trauma. DERMATOLOGIC: No rash, no itching, no lesions. ENDOCRINE: No polyuria, polydipsia, no heat or cold intolerance. No recent change in weight. HEMATOLOGICAL: No anemia or easy bruising or bleeding. NEUROLOGIC: No headache, seizures, numbness, tingling or weakness. PSYCHIATRIC:  No depression, no loss of interest in normal activity or change in sleep pattern.     Blood pressure 126/78, last menstrual period 04/04/2011.  Physical Exam:  HEENT:unremarkable Neck:Supple, midline, no thyroid megaly, no carotid bruits Lungs:  Clear to auscultation no rhonchi's or wheezes Heart:Regular rate and rhythm, no murmurs or gallops Breast Exam: Symmetrical and appears no palpable mass or tenderness no supraclavicular axillary lymphadenopathy Abdomen: Soft nontender no rebound guarding Pelvic:BUS within normal limits Vagina: No lesions or discharge Cervix: No visible lesions or discharge Uterus: Anteverted normal size shape and consistency nontender Adnexa: Left adnexal fullness and tenderness Extremities: No cords, no edema Rectal: Not examined  No results found for this or any previous visit (from the past 24 hour(s)).  No results found.  Assessment/Plan: 43 year old patient with premature ovarian failure having no vasomotor symptoms currently on Effexor to help with her smoking and depression. Persistent left adnexal mass hydrosalpinx versus ovarian mass? Normal CA 125 a few months ago we'll repeat today. The patient was counseled for laparoscopic left salpingectomy possible left cystectomy possible left salpingo-oophorectomy along with right salpingectomy. Additional risk as follows:                        Patient was counseled as to the risk of surgery to include the following:  1. Infection (prohylactic antibiotics will be administered)  2. DVT/Pulmonary Embolism (prophylactic pneumo compression stockings will be used)  3.Trauma to internal organs requiring additional surgical procedure to repair any injury to     Internal organs requiring perhaps additional hospitalization days.  4.Hemmorhage requiring transfusion and blood products which carry risks such as  anaphylactic reaction, hepatitis and AIDS  Patient had received literature information on the  procedure scheduled and all her questions were answered and fully accepts all risk.   [facility] HMD4:56 PMTD@Note : This dictation was prepared with  Dragon/digital dictation along withSmart phrase technology. Any transcriptional errors that result from this process are unintentional.      Terrance Mass 09/22/2013, 3:51 PM  Note: This dictation was prepared with  Dragon/digital dictation along withSmart phrase technology. Any transcriptional errors that result from this process are unintentional.

## 2013-09-23 LAB — CA 125: CA 125: 4.4 U/mL (ref 0.0–30.2)

## 2013-09-25 ENCOUNTER — Encounter (HOSPITAL_COMMUNITY): Payer: Self-pay | Admitting: Anesthesiology

## 2013-09-28 ENCOUNTER — Encounter (HOSPITAL_COMMUNITY): Payer: Self-pay

## 2013-09-28 ENCOUNTER — Encounter (HOSPITAL_COMMUNITY): Payer: BC Managed Care – PPO | Admitting: Anesthesiology

## 2013-09-28 ENCOUNTER — Ambulatory Visit (HOSPITAL_COMMUNITY)
Admission: RE | Admit: 2013-09-28 | Discharge: 2013-09-28 | Disposition: A | Discharge status: Home / Self Care | Payer: BC Managed Care – PPO | Source: Ambulatory Visit | Attending: Gynecology | Admitting: Gynecology

## 2013-09-28 ENCOUNTER — Encounter (HOSPITAL_COMMUNITY)
Admission: RE | Disposition: A | Discharge status: Home / Self Care | Payer: Self-pay | Source: Ambulatory Visit | Attending: Gynecology

## 2013-09-28 ENCOUNTER — Ambulatory Visit (HOSPITAL_COMMUNITY): Payer: BC Managed Care – PPO | Admitting: Anesthesiology

## 2013-09-28 DIAGNOSIS — Z803 Family history of malignant neoplasm of breast: Secondary | ICD-10-CM | POA: Insufficient documentation

## 2013-09-28 DIAGNOSIS — E2839 Other primary ovarian failure: Secondary | ICD-10-CM | POA: Insufficient documentation

## 2013-09-28 DIAGNOSIS — N9489 Other specified conditions associated with female genital organs and menstrual cycle: Secondary | ICD-10-CM | POA: Insufficient documentation

## 2013-09-28 DIAGNOSIS — Z808 Family history of malignant neoplasm of other organs or systems: Secondary | ICD-10-CM | POA: Insufficient documentation

## 2013-09-28 DIAGNOSIS — Z9889 Other specified postprocedural states: Secondary | ICD-10-CM

## 2013-09-28 DIAGNOSIS — Z8049 Family history of malignant neoplasm of other genital organs: Secondary | ICD-10-CM | POA: Insufficient documentation

## 2013-09-28 DIAGNOSIS — D279 Benign neoplasm of unspecified ovary: Secondary | ICD-10-CM | POA: Insufficient documentation

## 2013-09-28 DIAGNOSIS — Z8249 Family history of ischemic heart disease and other diseases of the circulatory system: Secondary | ICD-10-CM | POA: Insufficient documentation

## 2013-09-28 DIAGNOSIS — Z8371 Family history of colonic polyps: Secondary | ICD-10-CM | POA: Insufficient documentation

## 2013-09-28 DIAGNOSIS — F3289 Other specified depressive episodes: Secondary | ICD-10-CM | POA: Diagnosis not present

## 2013-09-28 DIAGNOSIS — R1904 Left lower quadrant abdominal swelling, mass and lump: Secondary | ICD-10-CM

## 2013-09-28 DIAGNOSIS — F329 Major depressive disorder, single episode, unspecified: Secondary | ICD-10-CM | POA: Insufficient documentation

## 2013-09-28 DIAGNOSIS — Z83719 Family history of colon polyps, unspecified: Secondary | ICD-10-CM | POA: Insufficient documentation

## 2013-09-28 DIAGNOSIS — F17200 Nicotine dependence, unspecified, uncomplicated: Secondary | ICD-10-CM | POA: Insufficient documentation

## 2013-09-28 HISTORY — PX: LAPAROSCOPIC BILATERAL SALPINGECTOMY: SHX5889

## 2013-09-28 LAB — CBC
HCT: 43.7 % (ref 36.0–46.0)
HEMOGLOBIN: 15.2 g/dL — AB (ref 12.0–15.0)
MCH: 32.7 pg (ref 26.0–34.0)
MCHC: 34.8 g/dL (ref 30.0–36.0)
MCV: 94 fL (ref 78.0–100.0)
Platelets: 231 10*3/uL (ref 150–400)
RBC: 4.65 MIL/uL (ref 3.87–5.11)
RDW: 12.5 % (ref 11.5–15.5)
WBC: 7.8 10*3/uL (ref 4.0–10.5)

## 2013-09-28 LAB — URINALYSIS, ROUTINE W REFLEX MICROSCOPIC
Bilirubin Urine: NEGATIVE
Glucose, UA: NEGATIVE mg/dL
Hgb urine dipstick: NEGATIVE
KETONES UR: NEGATIVE mg/dL
LEUKOCYTES UA: NEGATIVE
Nitrite: NEGATIVE
Protein, ur: NEGATIVE mg/dL
Specific Gravity, Urine: 1.015 (ref 1.005–1.030)
Urobilinogen, UA: 0.2 mg/dL (ref 0.0–1.0)
pH: 7.5 (ref 5.0–8.0)

## 2013-09-28 LAB — PREGNANCY, URINE: Preg Test, Ur: NEGATIVE

## 2013-09-28 SURGERY — SALPINGECTOMY, BILATERAL, LAPAROSCOPIC
Anesthesia: General | Site: Abdomen | Laterality: Bilateral

## 2013-09-28 MED ORDER — ONDANSETRON HCL 4 MG/2ML IJ SOLN
INTRAMUSCULAR | Status: DC | PRN
Start: 2013-09-28 — End: 2013-09-28
  Administered 2013-09-28: 4 mg via INTRAVENOUS

## 2013-09-28 MED ORDER — ROCURONIUM BROMIDE 100 MG/10ML IV SOLN
INTRAVENOUS | Status: AC
  Filled 2013-09-28: qty 1

## 2013-09-28 MED ORDER — FENTANYL CITRATE 0.05 MG/ML IJ SOLN
25.0000 ug | INTRAMUSCULAR | Status: DC | PRN
  Administered 2013-09-28: 50 ug via INTRAVENOUS

## 2013-09-28 MED ORDER — ROCURONIUM BROMIDE 100 MG/10ML IV SOLN
INTRAVENOUS | Status: DC | PRN
  Administered 2013-09-28: 40 mg via INTRAVENOUS

## 2013-09-28 MED ORDER — KETOROLAC TROMETHAMINE 30 MG/ML IJ SOLN
INTRAMUSCULAR | Status: DC | PRN
  Administered 2013-09-28: 30 mg via INTRAVENOUS

## 2013-09-28 MED ORDER — MIDAZOLAM HCL 2 MG/2ML IJ SOLN
INTRAMUSCULAR | Status: DC | PRN
  Administered 2013-09-28: 2 mg via INTRAVENOUS

## 2013-09-28 MED ORDER — PROPOFOL 10 MG/ML IV BOLUS
INTRAVENOUS | Status: DC | PRN
  Administered 2013-09-28: 20 mg via INTRAVENOUS
  Administered 2013-09-28: 200 mg via INTRAVENOUS

## 2013-09-28 MED ORDER — ONDANSETRON HCL 4 MG/2ML IJ SOLN
INTRAMUSCULAR | Status: AC
  Filled 2013-09-28: qty 2

## 2013-09-28 MED ORDER — FENTANYL CITRATE 0.05 MG/ML IJ SOLN
INTRAMUSCULAR | Status: AC
  Filled 2013-09-28: qty 2

## 2013-09-28 MED ORDER — FENTANYL CITRATE 0.05 MG/ML IJ SOLN
INTRAMUSCULAR | Status: AC
  Filled 2013-09-28: qty 5

## 2013-09-28 MED ORDER — LIDOCAINE HCL (CARDIAC) 20 MG/ML IV SOLN
INTRAVENOUS | Status: AC
  Filled 2013-09-28: qty 5

## 2013-09-28 MED ORDER — SCOPOLAMINE 1 MG/3DAYS TD PT72
MEDICATED_PATCH | TRANSDERMAL | Status: AC
  Administered 2013-09-28: 1.5 mg
  Filled 2013-09-28: qty 1

## 2013-09-28 MED ORDER — PROPOFOL 10 MG/ML IV EMUL
INTRAVENOUS | Status: AC
  Filled 2013-09-28: qty 20

## 2013-09-28 MED ORDER — LACTATED RINGERS IV SOLN
INTRAVENOUS | Status: DC
  Administered 2013-09-28 (×2): via INTRAVENOUS

## 2013-09-28 MED ORDER — LACTATED RINGERS IR SOLN
Status: DC | PRN
  Administered 2013-09-28: 3000 mL

## 2013-09-28 MED ORDER — DEXAMETHASONE SODIUM PHOSPHATE 4 MG/ML IJ SOLN
INTRAMUSCULAR | Status: AC
  Filled 2013-09-28: qty 1

## 2013-09-28 MED ORDER — GLYCOPYRROLATE 0.2 MG/ML IJ SOLN
INTRAMUSCULAR | Status: AC
  Filled 2013-09-28: qty 3

## 2013-09-28 MED ORDER — HEPARIN SODIUM (PORCINE) 5000 UNIT/ML IJ SOLN
INTRAMUSCULAR | Status: DC | PRN
  Administered 2013-09-28: 5000 [IU]

## 2013-09-28 MED ORDER — DEXAMETHASONE SODIUM PHOSPHATE 10 MG/ML IJ SOLN
INTRAMUSCULAR | Status: DC | PRN
Start: 2013-09-28 — End: 2013-09-28
  Administered 2013-09-28: 5 mg via INTRAVENOUS

## 2013-09-28 MED ORDER — MIDAZOLAM HCL 2 MG/2ML IJ SOLN
INTRAMUSCULAR | Status: AC
  Filled 2013-09-28: qty 2

## 2013-09-28 MED ORDER — NEOSTIGMINE METHYLSULFATE 10 MG/10ML IV SOLN
INTRAVENOUS | Status: DC | PRN
  Administered 2013-09-28: 3 mg via INTRAVENOUS

## 2013-09-28 MED ORDER — GLYCOPYRROLATE 0.2 MG/ML IJ SOLN
INTRAMUSCULAR | Status: DC | PRN
  Administered 2013-09-28: 0.6 mg via INTRAVENOUS

## 2013-09-28 MED ORDER — HEPARIN SODIUM (PORCINE) 5000 UNIT/ML IJ SOLN
INTRAMUSCULAR | Status: AC
  Filled 2013-09-28: qty 1

## 2013-09-28 MED ORDER — BUPIVACAINE HCL (PF) 0.25 % IJ SOLN
INTRAMUSCULAR | Status: AC
  Filled 2013-09-28: qty 30

## 2013-09-28 MED ORDER — LIDOCAINE HCL (CARDIAC) 20 MG/ML IV SOLN
INTRAVENOUS | Status: DC | PRN
  Administered 2013-09-28: 60 mg via INTRAVENOUS

## 2013-09-28 MED ORDER — CEFOTETAN DISODIUM 2 G IJ SOLR
2.0000 g | INTRAMUSCULAR | Status: AC
  Administered 2013-09-28: 2 g via INTRAVENOUS
  Filled 2013-09-28: qty 2

## 2013-09-28 MED ORDER — FENTANYL CITRATE 0.05 MG/ML IJ SOLN
INTRAMUSCULAR | Status: DC | PRN
  Administered 2013-09-28 (×2): 50 ug via INTRAVENOUS
  Administered 2013-09-28: 150 ug via INTRAVENOUS

## 2013-09-28 MED ORDER — BUPIVACAINE HCL (PF) 0.25 % IJ SOLN
INTRAMUSCULAR | Status: DC | PRN
  Administered 2013-09-28: 20 mL

## 2013-09-28 MED ORDER — NEOSTIGMINE METHYLSULFATE 10 MG/10ML IV SOLN
INTRAVENOUS | Status: AC
  Filled 2013-09-28: qty 1

## 2013-09-28 SURGICAL SUPPLY — 30 items
BARRIER ADHS 3X4 INTERCEED (GAUZE/BANDAGES/DRESSINGS) IMPLANT
BLADE SURG 11 STRL SS (BLADE) ×3 IMPLANT
CABLE HIGH FREQUENCY MONO STRZ (ELECTRODE) ×3 IMPLANT
CLOTH BEACON ORANGE TIMEOUT ST (SAFETY) ×3 IMPLANT
COVER MAYO STAND STRL (DRAPES) ×3 IMPLANT
DERMABOND ADVANCED (GAUZE/BANDAGES/DRESSINGS) ×2
DERMABOND ADVANCED .7 DNX12 (GAUZE/BANDAGES/DRESSINGS) ×1 IMPLANT
DRSG COVADERM PLUS 2X2 (GAUZE/BANDAGES/DRESSINGS) ×3 IMPLANT
DRSG OPSITE POSTOP 3X4 (GAUZE/BANDAGES/DRESSINGS) ×3 IMPLANT
FILTER SMOKE EVAC LAPAROSHD (FILTER) ×3 IMPLANT
GLOVE BIOGEL PI IND STRL 8 (GLOVE) ×1 IMPLANT
GLOVE BIOGEL PI INDICATOR 8 (GLOVE) ×2
GLOVE ECLIPSE 7.5 STRL STRAW (GLOVE) ×6 IMPLANT
GOWN STRL REUS W/TWL LRG LVL3 (GOWN DISPOSABLE) ×6 IMPLANT
NS IRRIG 1000ML POUR BTL (IV SOLUTION) ×3 IMPLANT
PACK LAPAROSCOPY BASIN (CUSTOM PROCEDURE TRAY) ×3 IMPLANT
POUCH SPECIMEN RETRIEVAL 10MM (ENDOMECHANICALS) ×3 IMPLANT
PROTECTOR NERVE ULNAR (MISCELLANEOUS) ×6 IMPLANT
SET IRRIG TUBING LAPAROSCOPIC (IRRIGATION / IRRIGATOR) ×3 IMPLANT
SHEARS HARMONIC ACE PLUS 36CM (ENDOMECHANICALS) ×3 IMPLANT
SOLUTION ELECTROLUBE (MISCELLANEOUS) ×3 IMPLANT
SUT VIC AB 3-0 PS2 18 (SUTURE) ×2
SUT VIC AB 3-0 PS2 18XBRD (SUTURE) ×1 IMPLANT
SUT VICRYL 0 UR6 27IN ABS (SUTURE) ×3 IMPLANT
TOWEL OR 17X24 6PK STRL BLUE (TOWEL DISPOSABLE) ×6 IMPLANT
TRAY FOLEY CATH 14FR (SET/KITS/TRAYS/PACK) ×3 IMPLANT
TROCAR XCEL NON-BLD 11X100MML (ENDOMECHANICALS) ×3 IMPLANT
TROCAR XCEL NON-BLD 5MMX100MML (ENDOMECHANICALS) ×3 IMPLANT
TROCAR XCEL OPT SLVE 5M 100M (ENDOMECHANICALS) ×3 IMPLANT
WARMER LAPAROSCOPE (MISCELLANEOUS) ×3 IMPLANT

## 2013-09-28 NOTE — Anesthesia Postprocedure Evaluation (Signed)
  Anesthesia Post-op Note  Patient: Melanie Owens  Procedure(s) Performed: Procedure(s): LAPAROSCOPIC BILATERAL SALPINGECTOMY; PELVIC WASHINGS (Bilateral)  Patient Location: PACU  Anesthesia Type:General  Level of Consciousness: awake, alert  and oriented  Airway and Oxygen Therapy: Patient Spontanous Breathing  Post-op Pain: mild  Post-op Assessment: Post-op Vital signs reviewed, Patient's Cardiovascular Status Stable, Respiratory Function Stable, Patent Airway, No signs of Nausea or vomiting and Pain level controlled  Post-op Vital Signs: Reviewed and stable  Last Vitals:  Filed Vitals:   09/28/13 1430  BP: 118/78  Pulse:   Temp: 36.8 C  Resp:     Complications: No apparent anesthesia complications

## 2013-09-28 NOTE — Discharge Instructions (Signed)
DISCHARGE INSTRUCTIONS: Laparoscopy  The following instructions have been prepared to help you care for yourself upon your return home today.  **You may take ibuprofen/Advil/Motrin after 7:55 today**  Wound care:  Do not get the incision wet for the first 24 hours. The incision should be kept clean and dry.  The Band-Aids or dressings may be removed the day after surgery.  Should the incision become sore, red, and swollen after the first week, check with your doctor.  Personal hygiene:  Shower the day after your procedure.  Activity and limitations:  Do NOT drive or operate any equipment today.  Do NOT lift anything more than 15 pounds for 2-3 weeks after surgery.  Do NOT rest in bed all day.  Walking is encouraged. Walk each day, starting slowly with 5-minute walks 3 or 4 times a day. Slowly increase the length of your walks.  Walk up and down stairs slowly.  Do NOT do strenuous activities, such as golfing, playing tennis, bowling, running, biking, weight lifting, gardening, mowing, or vacuuming for 2-4 weeks. Ask your doctor when it is okay to start.  Diet: Eat a light meal as desired this evening. You may resume your usual diet tomorrow.  Return to work: This is dependent on the type of work you do. For the most part you can return to a desk job within a week of surgery. If you are more active at work, please discuss this with your doctor.  What to expect after your surgery: You may have a slight burning sensation when you urinate on the first day. You may have a very small amount of blood in the urine. Expect to have a small amount of vaginal discharge/light bleeding for 1-2 weeks. It is not unusual to have abdominal soreness and bruising for up to 2 weeks. You may be tired and need more rest for about 1 week. You may experience shoulder pain for 24-72 hours. Lying flat in bed may relieve it.  Call your doctor for any of the following:  Develop a fever of 100.4 or  greater  Inability to urinate 6 hours after discharge from hospital  Severe pain not relieved by pain medications  Persistent of heavy bleeding at incision site  Redness or swelling around incision site after a week  Increasing nausea or vomiting  Patient Signature________________________________________  Nurse Signature_________________________________________  Support person's signature__________________________________

## 2013-09-28 NOTE — H&P (View-Only) (Signed)
Melanie Owens is an 43 y.o. female. Who presents to the office for preop consultation. The patient was last seen in the office on 08/05/2013 her history is as follows:  Patient presented to the office on 08/05/2013 for followup that was perceived to be a left ovarian cyst versus a left hydrosalpinx. A previous ultrasound done on April 22 demonstrated the following:  Uterus measured 8.9 x 5.5 x 3.2 cm with endometrial stripe at 2.5 mm. Right ovary was normal. Left ovary with adjacent thin-walled cyst measuring 5.0 x 5.2 x 4.3 cm with average size of 4.8 cm a thick septum cystic area echo free mass avascular was noted. Arterial blood flow was seen to the left ovary. There was no fluid in the cul-de-sac.  Patient reported not having had a menstrual cycle since 2012 and she had a normal TSH and prolactin and her [place] was in the menopausal range. She denied any vasomotor symptoms. She does smoke half pack of cigarettes per day. She was started on Effexor which is helped curtail her smoking. A CA 125 at that time was normal with a value of 3.5. Patient received a shot of Depo-Provera and then return to the office on 08/05/2013 for a follow up ultrasound which demonstrated the following:  Uterus measured 8.4 x 5.5 x 3.4 cm with endometrial stripe of 5.1 mm. Right ovary was normal. Left ovary thin-walled coma shaped cystic mass measuring 5.2 x 4.3 x 4.4 cm average size 4.6 cm with thick septum and several thin septations mass was avascular other than septum shows vascular flow. No fluid in the cul-de-sac.    the assessment and plan were as follows: Persistent left adnexal mass appears to be a hydrosalpinx. Patient anxious. Normal CA 125 2 months ago. Will repeat a C1 25 today. We'll schedule for laparoscopic left cystectomy possible left salpingectomy possible left salpingo-oophorectomy along with right salpingectomy.    Pertinent Gynecological History: Menses: post-menopausal Bleeding:  none Contraception: post menopausal status DES exposure: unknown Blood transfusions: none Sexually transmitted diseases: no past history Previous GYN Procedures: none  Last mammogram: normal Date: 2014 Last pap: normal Date: 2014 OB History: G6, P3A3   Menstrual History: Menarche age: 61  Patient's last menstrual period was 04/04/2011.    Past Medical History  Diagnosis Date  . Depression   . Premature ovarian failure     History reviewed. No pertinent past surgical history.  Family History  Problem Relation Age of Onset  . Breast cancer Mother 68  . Thyroid cancer Father 47  . Hypertension Father   . Thyroid cancer Sister 20  . Breast cancer Paternal Grandmother   . Cervical cancer Daughter 80  . Cancer Sister     32's  . Colon polyps Father     Social History:  reports that she has been smoking Cigarettes.  She has a 12.5 pack-year smoking history. She has never used smokeless tobacco. She reports that she does not drink alcohol or use illicit drugs.  Allergies: No Known Allergies   (Not in a hospital admission)  REVIEW OF SYSTEMS: A ROS was performed and pertinent positives and negatives are included in the history.  GENERAL: No fevers or chills. HEENT: No change in vision, no earache, sore throat or sinus congestion. NECK: No pain or stiffness. CARDIOVASCULAR: No chest pain or pressure. No palpitations. PULMONARY: No shortness of breath, cough or wheeze. GASTROINTESTINAL: No abdominal pain, nausea, vomiting or diarrhea, melena or bright red blood per rectum. GENITOURINARY: No urinary  frequency, urgency, hesitancy or dysuria. MUSCULOSKELETAL: No joint or muscle pain, no back pain, no recent trauma. DERMATOLOGIC: No rash, no itching, no lesions. ENDOCRINE: No polyuria, polydipsia, no heat or cold intolerance. No recent change in weight. HEMATOLOGICAL: No anemia or easy bruising or bleeding. NEUROLOGIC: No headache, seizures, numbness, tingling or weakness. PSYCHIATRIC:  No depression, no loss of interest in normal activity or change in sleep pattern.     Blood pressure 126/78, last menstrual period 04/04/2011.  Physical Exam:  HEENT:unremarkable Neck:Supple, midline, no thyroid megaly, no carotid bruits Lungs:  Clear to auscultation no rhonchi's or wheezes Heart:Regular rate and rhythm, no murmurs or gallops Breast Exam: Symmetrical and appears no palpable mass or tenderness no supraclavicular axillary lymphadenopathy Abdomen: Soft nontender no rebound guarding Pelvic:BUS within normal limits Vagina: No lesions or discharge Cervix: No visible lesions or discharge Uterus: Anteverted normal size shape and consistency nontender Adnexa: Left adnexal fullness and tenderness Extremities: No cords, no edema Rectal: Not examined  No results found for this or any previous visit (from the past 24 hour(s)).  No results found.  Assessment/Plan: 43 year old patient with premature ovarian failure having no vasomotor symptoms currently on Effexor to help with her smoking and depression. Persistent left adnexal mass hydrosalpinx versus ovarian mass? Normal CA 125 a few months ago we'll repeat today. The patient was counseled for laparoscopic left salpingectomy possible left cystectomy possible left salpingo-oophorectomy along with right salpingectomy. Additional risk as follows:                        Patient was counseled as to the risk of surgery to include the following:  1. Infection (prohylactic antibiotics will be administered)  2. DVT/Pulmonary Embolism (prophylactic pneumo compression stockings will be used)  3.Trauma to internal organs requiring additional surgical procedure to repair any injury to     Internal organs requiring perhaps additional hospitalization days.  4.Hemmorhage requiring transfusion and blood products which carry risks such as  anaphylactic reaction, hepatitis and AIDS  Patient had received literature information on the  procedure scheduled and all her questions were answered and fully accepts all risk.   [facility] HMD4:56 PMTD@Note : This dictation was prepared with  Dragon/digital dictation along withSmart phrase technology. Any transcriptional errors that result from this process are unintentional.      Terrance Mass 09/22/2013, 3:51 PM  Note: This dictation was prepared with  Dragon/digital dictation along withSmart phrase technology. Any transcriptional errors that result from this process are unintentional.

## 2013-09-28 NOTE — Anesthesia Preprocedure Evaluation (Signed)
Anesthesia Evaluation  Patient identified by MRN, date of birth, ID band Patient awake    Reviewed: Allergy & Precautions, H&P , Patient's Chart, lab work & pertinent test results, reviewed documented beta blocker date and time   Airway Mallampati: II  TM Distance: >3 FB Neck ROM: full    Dental no notable dental hx.    Pulmonary Current Smoker,  breath sounds clear to auscultation  Pulmonary exam normal       Cardiovascular Rhythm:regular Rate:Normal     Neuro/Psych    GI/Hepatic   Endo/Other    Renal/GU      Musculoskeletal   Abdominal   Peds  Hematology   Anesthesia Other Findings   Reproductive/Obstetrics                             Anesthesia Physical Anesthesia Plan  ASA: II  Anesthesia Plan: General   Post-op Pain Management:    Induction: Intravenous  Airway Management Planned: Oral ETT  Additional Equipment:   Intra-op Plan:   Post-operative Plan: Extubation in OR  Informed Consent: I have reviewed the patients History and Physical, chart, labs and discussed the procedure including the risks, benefits and alternatives for the proposed anesthesia with the patient or authorized representative who has indicated his/her understanding and acceptance.   Dental Advisory Given and Dental advisory given  Plan Discussed with: CRNA and Surgeon  Anesthesia Plan Comments: (  Discussed general anesthesia, including possible nausea, instrumentation of airway, sore throat,pulmonary aspiration, etc. I asked if the were any outstanding questions, or  concerns before we proceeded. )        Anesthesia Quick Evaluation  

## 2013-09-28 NOTE — Transfer of Care (Signed)
Immediate Anesthesia Transfer of Care Note  Patient: Melanie Owens  Procedure(s) Performed: Procedure(s): LAPAROSCOPIC BILATERAL SALPINGECTOMY; PELVIC WASHINGS (Bilateral)  Patient Location: PACU  Anesthesia Type:General  Level of Consciousness: awake, alert  and oriented  Airway & Oxygen Therapy: Patient Spontanous Breathing and Patient connected to nasal cannula oxygen  Post-op Assessment: Report given to PACU RN and Post -op Vital signs reviewed and stable  Post vital signs: Reviewed and stable  Complications: No apparent anesthesia complications

## 2013-09-28 NOTE — Op Note (Signed)
Operative Note  09/28/2013  2:32 PM  PATIENT:  Melanie Owens  43 y.o. female  PRE-OPERATIVE DIAGNOSIS:  left adnexal mass  POST-OPERATIVE DIAGNOSIS:  left adnexal mass  PROCEDURE:  Procedure(s): LAPAROSCOPIC BILATERAL SALPINGECTOMY; PELVIC WASHINGS  SURGEON:  Surgeon(s): Terrance Mass, MD Anastasio Auerbach, MD  ANESTHESIA:   general  FINDINGS: A left 3 x 4 cm paratubal cysts with normal appearing bilateral ovaries and contralateral fallopian tube. No adhesions or endometriosis in the cul-de-sac. The appendix was visualized and appeared normal as well a smooth liver surface and gallbladder.  DESCRIPTION OF OPERATION: The patient was taken to the operating room where she underwent a successful general endotracheal anesthesia. A time out had been undertaken to discuss out loud the procedure which was to be performed and patient's medical history. Patient received 2 g of Cefotan for prophylaxis. Patient had PSA stocking for prophylaxis as well. The abdomen vagina and perineum were prepped and draped in usual sterile fashion a bimanual examination had demonstrated slightly retroverted uterus. A Hulka tenaculum was placed for manipulation during laparoscopic procedure. A Foley catheter was inserted to monitor urinary output. A small subumbilical incision was made followed by introduction of a 10/11 no major Opti-Vu trocar into the abdominal peritoneal cavity. Approximately 2-1/2 L of carbon dioxide was infiltrated into the abdominal pelvic cavity. 2 additional 5 m ports were made under laparoscopic guidance on the right and left lower abdomen. Systematic inspection of the abdomen and pelvis with findings as described above. Attention was then placed to the right fallopian tube which was placed under tension and with the use of the harmonic scalpel the mesosalpinx was coaptated and transected all the way to the proximal portion of the fallopian tube and transected there. Similar procedure was  carried out on the contralateral side and removing the paratubal cyst and leaving both ovaries intact. Following this a 5 mL scope was placed in the right lower abdomen and a laparoscopic Endo pouch was introduced through the umbilicus and both fallopian tubes were placed into the bag and retrieved through the umbilicus. The specimen was submitted for histological evaluation. Of note before removing the fallopian tubes a pelvic washing had been obtained and submitted for cytological evaluation. The CO2 was removed from the abdominal pelvic cavity. The subumbilical fascia was then closed with a figure-of-eight of 0 Vicryl suture all 3 port site incisions were reapproximated with Dermabond glue. For postoperative analgesia 0.25% Marcaine was infiltrated in all 3 incisions ports for a total of 20 cc. Patient was given Toradol 30 mg IV in route to the recovery room after the Foley catheter was removed. Blood loss was less than 25 cc. Patient was extubated and transferred with stable vital signs.  ESTIMATED BLOOD LOSS: Less than 25 cc  Intake/Output Summary (Last 24 hours) at 09/28/13 1432 Last data filed at 09/28/13 1418  Gross per 24 hour  Intake   1400 ml  Output    125 ml  Net   1275 ml     BLOOD ADMINISTERED:none   LOCAL MEDICATIONS USED:  MARCAINE   0.25% infiltrated at all 3 incisions ports for postoperative analgesia total of 20 cc  SPECIMEN:  Source of Specimen:  Bilateral fallopian tubes  DISPOSITION OF SPECIMEN:  PATHOLOGY  COUNTS:  YES  PLAN OF CARE: Transfer to [place] HMD2:32 PMTD@  Note: This dictation was prepared with  Dragon/digital dictation along withSmart phrase technology. Any transcriptional errors that result from this process are unintentional.

## 2013-09-28 NOTE — Interval H&P Note (Signed)
History and Physical Interval Note:  09/28/2013 1:01 PM  Melanie Owens  has presented today for surgery, with the diagnosis of left adnexal mass  The various methods of treatment have been discussed with the patient and family. After consideration of risks, benefits and other options for treatment, the patient has consented to  Procedure(s): LAPAROSCOPIC BILATERAL SALPINGECTOMY POSSIBLE LEFT SALPINGO-OOPHORECTOMY (Left) as a surgical intervention .  The patient's history has been reviewed, patient examined, no change in status, stable for surgery.  I have reviewed the patient's chart and labs.  Questions were answered to the patient's satisfaction.     Terrance Mass

## 2013-09-29 ENCOUNTER — Telehealth: Payer: Self-pay | Admitting: *Deleted

## 2013-09-29 ENCOUNTER — Encounter (HOSPITAL_COMMUNITY): Payer: Self-pay | Admitting: Gynecology

## 2013-09-29 MED ORDER — IBUPROFEN 800 MG PO TABS: 800.0000 mg | ORAL_TABLET | Freq: Three times a day (TID) | ORAL | Status: DC

## 2013-09-29 MED FILL — Heparin Sodium (Porcine) Inj 5000 Unit/ML: INTRAMUSCULAR | Qty: 1 | Status: AC

## 2013-09-29 NOTE — Telephone Encounter (Signed)
Pt had surgery on 09/28/13 LAPAROSCOPY. pt was prescribed percocet 7.5/325 mg states she is still having discomfort and due to discomfort she is not able to rest good. Pt asked if she could take 1 1/2 tablet of medication? Please advise

## 2013-09-29 NOTE — Telephone Encounter (Signed)
Yes but  every 6 hours. I would also recommend that she take Motrin 800 mg 3 times a day which you can call in  30 tablets. Also that her pathology report was benign

## 2013-09-29 NOTE — Telephone Encounter (Signed)
Pt informed with the below note. rx sent

## 2013-10-14 ENCOUNTER — Telehealth: Payer: Self-pay | Admitting: *Deleted

## 2013-10-14 NOTE — Telephone Encounter (Signed)
Pt has surgery on 09/28/13 for laparoscopic. Pt said has to mouth pain and went to her dentist and per pt her dentist found her jaw bone was exposed. Pt said it had to come from her surgery on 09/28/13 went she was put to sleep. Dentist placed her on amoxacillin due to this. Pt has post-op appointment on 10/16/13 with provider. Pt said she would speak with Dr.Fernandez about this at post op appointment.

## 2013-10-16 ENCOUNTER — Ambulatory Visit (INDEPENDENT_AMBULATORY_CARE_PROVIDER_SITE_OTHER): Payer: BC Managed Care – PPO | Admitting: Gynecology

## 2013-10-16 ENCOUNTER — Encounter: Payer: Self-pay | Admitting: Gynecology

## 2013-10-16 VITALS — BP 128/72

## 2013-10-16 DIAGNOSIS — Z09 Encounter for follow-up examination after completed treatment for conditions other than malignant neoplasm: Secondary | ICD-10-CM

## 2013-10-16 NOTE — Progress Notes (Signed)
   Patient presents to the office today for a postop visit. Patient on 09/28/2013 underwent laparoscopic bilateral salpingectomy as a result of preop diagnosis of left adnexal mass. Findings from surgery as follows:  A left 3 x 4 cm paratubal cysts with normal appearing bilateral ovaries and contralateral fallopian tube. No adhesions or endometriosis in the cul-de-sac. The appendix was visualized and appeared normal as well a smooth liver surface and gallbladder.  Pathology report was as follows: Diagnosis 1. Fallopian tube, right - BENIGN FALLOPIAN TUBE. 2. Adnexa - ovary +/- tube, neoplastic (left) - BENIGN SEROUS CYSTADENOFIBROMA. - AMENDED DIAGNOSIS: BENIGN FALLOPIAN TUBE.  Patient has done well from her surgery but states that she had gone to a dentist because of pain in her jaw and that she will need surgery as a result of protruding through her gum area that started after she had left the hospital. She is going to make an appointment to talk to the anesthesiologist. I was not aware there was any problems during intubation or extubation.  GYN exam: Abdomen: Soft nontender no rebound guarding port sites completely healed. 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 Patient may resume normal activity. We will see her back in one year or when necessary.

## 2013-10-21 ENCOUNTER — Ambulatory Visit
Admission: RE | Admit: 2013-10-21 | Discharge: 2013-10-21 | Disposition: A | Discharge status: Home / Self Care | Payer: BC Managed Care – PPO | Source: Ambulatory Visit

## 2013-10-21 DIAGNOSIS — Z1231 Encounter for screening mammogram for malignant neoplasm of breast: Secondary | ICD-10-CM

## 2013-10-23 ENCOUNTER — Other Ambulatory Visit: Payer: Self-pay | Admitting: Gynecology

## 2013-10-23 DIAGNOSIS — R928 Other abnormal and inconclusive findings on diagnostic imaging of breast: Secondary | ICD-10-CM

## 2013-10-29 ENCOUNTER — Ambulatory Visit
Admission: RE | Admit: 2013-10-29 | Discharge: 2013-10-29 | Disposition: A | Discharge status: Home / Self Care | Payer: BC Managed Care – PPO | Source: Ambulatory Visit | Attending: Gynecology | Admitting: Gynecology

## 2013-10-29 DIAGNOSIS — R928 Other abnormal and inconclusive findings on diagnostic imaging of breast: Secondary | ICD-10-CM

## 2013-11-24 ENCOUNTER — Encounter: Payer: Self-pay | Admitting: Internal Medicine

## 2013-11-24 ENCOUNTER — Ambulatory Visit (INDEPENDENT_AMBULATORY_CARE_PROVIDER_SITE_OTHER): Payer: BC Managed Care – PPO | Admitting: Internal Medicine

## 2013-11-24 VITALS — BP 112/70 | HR 92 | Ht 65.0 in | Wt 152.0 lb

## 2013-11-24 DIAGNOSIS — K921 Melena: Secondary | ICD-10-CM

## 2013-11-24 DIAGNOSIS — Z8371 Family history of colonic polyps: Secondary | ICD-10-CM

## 2013-11-24 MED ORDER — MOVIPREP 100 G PO SOLR: 1.0000 | Freq: Once | ORAL | Status: DC

## 2013-11-24 NOTE — Progress Notes (Signed)
Melanie Owens 1970/10/03 [id number]  Note: This dictation was prepared with Dragon digital system. Any transcriptional errors that result from this procedure are unintentional.   History of Present Illness:  This is a 43 year old white female with several painless episodes of low volume hematochezia which have occurred in the last several months. She describes blood on the toilet tissue but not in the commode. She just recently underwent a bilateral salpingectomy on 09/28/2013 for a left adnexal mass which revealed benign cyst adenoma. She has a positive family history of colon polyps in her father. She tends to be constipated having bowel movements every 3-4 days. She has stool softeners at home. She has a sedentary job. She does not get enough exercise. She has gained about 10 pounds recently. Her fluid intake consists of mostly  coffee.    Past Medical History  Diagnosis Date  . Depression   . Premature ovarian failure     Past Surgical History  Procedure Laterality Date  . Wisdom tooth extraction    . Laparoscopic bilateral salpingectomy Bilateral 09/28/2013    Procedure: LAPAROSCOPIC BILATERAL SALPINGECTOMY; PELVIC WASHINGS;  Surgeon: Terrance Mass, MD;  Location: [facility];  Service: Gynecology;  Laterality: Bilateral;    No Known Allergies  Family history and social history have been reviewed.  Review of Systems:   The remainder of the 10 point ROS is negative except as outlined in the H&P  Physical Exam: General Appearance Well developed, in no distress Eyes  Non icteric  HEENT  Non traumatic, normocephalic  Mouth No lesion, tongue papillated, no cheilosis Neck Supple without adenopathy, thyroid not enlarged, no carotid bruits, no JVD Lungs Clear to auscultation bilaterally COR Normal S1, normal S2, regular rhythm, no murmur, quiet precordium Abdomen soft with minimal tenderness along sigmoid colon. No distention. Normal active bowel sounds. No CVA tenderness. Liver  edge at costal margin Rectal normal perianal area. Normal rectal sphincter tone. Small brown formed Hemoccult-negative stool Extremities  No pedal edema Skin No lesions Neurological Alert and oriented x 3 Psychological Normal mood and affect  Assessment and Plan:   Problem #20 43 year old white female with intermittent painless low-volume bleeding likely of ano- rectal origin. I suspect either an anal fissure or small internal hemorrhoids. She has constipation and the episodes of bleeding are associated with straining. She also has a family history of colon polyps and for that reason, I would recommend a screening colonoscopy. She is Hemoccult negative on my exam. We have discussed a high fiber diet and increasing fluid intake. We have also discussed increasing her exercise. She may use topical 1% hydrocortisone cream when necessary for rectal bleeding and stool softeners on an as necessary basis. We have discussed prep for colonoscopy, the sedation as well as possible complications. She is interested in scheduling in near future.    Delfin Edis 11/24/2013

## 2013-11-24 NOTE — Patient Instructions (Addendum)
You have been scheduled for a colonoscopy. Please follow written instructions given to you at your visit today.  Please pick up your prep kit at the pharmacy within the next 1-3 days. If you use inhalers (even only as needed), please bring them with you on the day of your procedure. Your physician has requested that you go to www.startemmi.com and enter the access code given to you at your visit today. This web site gives a general overview about your procedure. However, you should still follow specific instructions given to you by our office regarding your preparation for the procedure.  Please purchase the following medications over the counter and take as directed: Stool Softeners as needed for constipation Cortisone cream 1% for rectal irritation  High-Fiber Diet Fiber is found in fruits, vegetables, and grains. A high-fiber diet encourages the addition of more whole grains, legumes, fruits, and vegetables in your diet. The recommended amount of fiber for adult males is 38 g per day. For adult females, it is 25 g per day. Pregnant and lactating women should get 28 g of fiber per day. If you have a digestive or bowel problem, ask your caregiver for advice before adding high-fiber foods to your diet. Eat a variety of high-fiber foods instead of only a select few type of foods.  PURPOSE  To increase stool bulk.  To make bowel movements more regular to prevent constipation.  To lower cholesterol.  To prevent overeating. WHEN IS THIS DIET USED?  It may be used if you have constipation and hemorrhoids.  It may be used if you have uncomplicated diverticulosis (intestine condition) and irritable bowel syndrome.  It may be used if you need help with weight management.  It may be used if you want to add it to your diet as a protective measure against atherosclerosis, diabetes, and cancer. SOURCES OF FIBER  Whole-grain breads and cereals.  Fruits, such as apples, oranges, bananas, berries,  prunes, and pears.  Vegetables, such as green peas, carrots, sweet potatoes, beets, broccoli, cabbage, spinach, and artichokes.  Legumes, such split peas, soy, lentils.  Almonds. FIBER CONTENT IN FOODS Starches and Grains / Dietary Fiber (g)  Cheerios, 1 cup / 3 g  Corn Flakes cereal, 1 cup / 0.7 g  Rice crispy treat cereal, 1 cup / 0.3 g  Instant oatmeal (cooked),  cup / 2 g  Frosted wheat cereal, 1 cup / 5.1 g  Brown, long-grain rice (cooked), 1 cup / 3.5 g  White, long-grain rice (cooked), 1 cup / 0.6 g  Enriched macaroni (cooked), 1 cup / 2.5 g Legumes / Dietary Fiber (g)  Baked beans (canned, plain, or vegetarian),  cup / 5.2 g  Kidney beans (canned),  cup / 6.8 g  Pinto beans (cooked),  cup / 5.5 g Breads and Crackers / Dietary Fiber (g)  Plain or honey graham crackers, 2 squares / 0.7 g  Saltine crackers, 3 squares / 0.3 g  Plain, salted pretzels, 10 pieces / 1.8 g  Whole-wheat bread, 1 slice / 1.9 g  White bread, 1 slice / 0.7 g  Raisin bread, 1 slice / 1.2 g  Plain bagel, 3 oz / 2 g  Flour tortilla, 1 oz / 0.9 g  Corn tortilla, 1 small / 1.5 g  Hamburger or hotdog bun, 1 small / 0.9 g Fruits / Dietary Fiber (g)  Apple with skin, 1 medium / 4.4 g  Sweetened applesauce,  cup / 1.5 g  Banana,  medium / 1.5  g  Grapes, 10 grapes / 0.4 g  Orange, 1 small / 2.3 g  Raisin, 1.5 oz / 1.6 g  Melon, 1 cup / 1.4 g Vegetables / Dietary Fiber (g)  Green beans (canned),  cup / 1.3 g  Carrots (cooked),  cup / 2.3 g  Broccoli (cooked),  cup / 2.8 g  Peas (cooked),  cup / 4.4 g  Mashed potatoes,  cup / 1.6 g  Lettuce, 1 cup / 0.5 g  Corn (canned),  cup / 1.6 g  Tomato,  cup / 1.1 g Document Released: 01/29/2005 Document Revised: 07/31/2011 Document Reviewed: 05/03/2011 ExitCare Patient Information 2015 [place], [place]. This information is not intended to replace advice given to you by your health care provider. Make sure you  discuss any questions you have with your health care provider.    CC:Dr Toney Rakes

## 2013-12-02 ENCOUNTER — Encounter: Payer: Self-pay | Admitting: Internal Medicine

## 2013-12-14 ENCOUNTER — Encounter: Payer: Self-pay | Admitting: Internal Medicine

## 2013-12-24 ENCOUNTER — Telehealth: Payer: Self-pay | Admitting: *Deleted

## 2013-12-24 NOTE — Telephone Encounter (Signed)
Pt currently taking Effexor 75 mg requesting to stop medication due to weight gain. Pt takes for hot flashes and depression asked if how to wean off? Please advise

## 2013-12-24 NOTE — Telephone Encounter (Signed)
Week 1-2: Begin taking one tablet every other day Week 3-4: Take 1 tablet 3 times a week only Week 5: Take 1 twice a week Weeks 6 completely off

## 2013-12-24 NOTE — Telephone Encounter (Signed)
Pt informed with the below note. 

## 2014-01-05 ENCOUNTER — Encounter: Payer: Self-pay | Admitting: Internal Medicine

## 2014-01-13 ENCOUNTER — Encounter: Payer: BC Managed Care – PPO | Admitting: Internal Medicine

## 2014-02-11 ENCOUNTER — Encounter: Payer: Self-pay | Admitting: Women's Health

## 2014-02-11 ENCOUNTER — Ambulatory Visit (INDEPENDENT_AMBULATORY_CARE_PROVIDER_SITE_OTHER): Payer: BC Managed Care – PPO | Admitting: Women's Health

## 2014-02-11 DIAGNOSIS — N898 Other specified noninflammatory disorders of vagina: Secondary | ICD-10-CM

## 2014-02-11 DIAGNOSIS — B3731 Acute candidiasis of vulva and vagina: Secondary | ICD-10-CM

## 2014-02-11 DIAGNOSIS — B373 Candidiasis of vulva and vagina: Secondary | ICD-10-CM

## 2014-02-11 DIAGNOSIS — B9689 Other specified bacterial agents as the cause of diseases classified elsewhere: Secondary | ICD-10-CM

## 2014-02-11 DIAGNOSIS — N760 Acute vaginitis: Secondary | ICD-10-CM

## 2014-02-11 DIAGNOSIS — F4322 Adjustment disorder with anxiety: Secondary | ICD-10-CM

## 2014-02-11 DIAGNOSIS — A499 Bacterial infection, unspecified: Secondary | ICD-10-CM

## 2014-02-11 LAB — WET PREP FOR TRICH, YEAST, CLUE: TRICH WET PREP: NONE SEEN

## 2014-02-11 MED ORDER — ESCITALOPRAM OXALATE 10 MG PO TABS: 10.0000 mg | ORAL_TABLET | Freq: Every day | ORAL | Status: DC

## 2014-02-11 MED ORDER — FLUCONAZOLE 150 MG PO TABS: 150.0000 mg | ORAL_TABLET | Freq: Once | ORAL | Status: DC

## 2014-02-11 MED ORDER — METRONIDAZOLE 0.75 % VA GEL: VAGINAL | Status: DC

## 2014-02-11 NOTE — Patient Instructions (Signed)
Bacterial Vaginosis Bacterial vaginosis is an infection of the vagina. It happens when too many of certain germs (bacteria) grow in the vagina. HOME CARE  Take your medicine as told by your doctor.  Finish your medicine even if you start to feel better.  Do not have sex until you finish your medicine and are better.  Tell your sex partner that you have an infection. They should see their doctor for treatment.  Practice safe sex. Use condoms. Have only one sex partner. GET HELP IF:  You are not getting better after 3 days of treatment.  You have more grey fluid (discharge) coming from your vagina than before.  You have more pain than before.  You have a fever. MAKE SURE YOU:   Understand these instructions.  Will watch your condition.  Will get help right away if you are not doing well or get worse. Document Released: 11/08/2007 Document Revised: 11/19/2012 Document Reviewed: 09/10/2012 ExitCare Patient Information 2015 [employer]. This information is not intended to replace advice given to you by your health care provider. Make sure you discuss any questions you have with your health care provider.  

## 2014-02-11 NOTE — Progress Notes (Signed)
Patient ID: MEKLIT COTTA, female   DOB: [birth year], 43 y.o.   MRN: [id number] Presents with complaint of vaginal discharge with irritation, no relief with Monistat. Also had been on Effexor but was having weight gain and did not feel good relief of anxiety. Would like to try different medication. Is not in counseling. Denies suicidal ideation, states becomes angry quickly Same partner. 09/2013 bilateral salpingectomy.  Exam: Appears well. External genitalia slight erythema, speculum exam moderate amount of watery discharge, wet prep positive for yeast, amines, clues, TNTC bacteria. Bimanual no CMT or adnexal fullness or tenderness.  Yeast vaginitis Bacteria vaginosis Anxiety  Plan: MetroGel vaginal cream 1 applicator at bedtime 5, alcohol precautions reviewed, Diflucan 150 by mouth times one dose with refill. Instructed to call if no relief of discharge. Options reviewed for anxiety, strongly encouraged counseling, will try Lexapro 10 mg by mouth daily, increase regular daily activity/walking.

## 2014-02-12 HISTORY — PX: COLONOSCOPY: SHX174

## 2014-03-03 ENCOUNTER — Encounter: Payer: Self-pay | Admitting: Internal Medicine

## 2014-03-03 ENCOUNTER — Ambulatory Visit (AMBULATORY_SURGERY_CENTER): Payer: BC Managed Care – PPO | Admitting: Internal Medicine

## 2014-03-03 VITALS — BP 107/70 | HR 79 | Temp 98.2°F | Resp 14 | Ht 65.0 in | Wt 152.0 lb

## 2014-03-03 DIAGNOSIS — D128 Benign neoplasm of rectum: Secondary | ICD-10-CM

## 2014-03-03 DIAGNOSIS — K621 Rectal polyp: Secondary | ICD-10-CM

## 2014-03-03 DIAGNOSIS — K921 Melena: Secondary | ICD-10-CM

## 2014-03-03 MED ORDER — SODIUM CHLORIDE 0.9 % IV SOLN: 500.0000 mL | INTRAVENOUS | Status: DC

## 2014-03-03 NOTE — Progress Notes (Signed)
Called to room to assist during endoscopic procedure.  Patient ID and intended procedure confirmed with present staff. Received instructions for my participation in the procedure from the performing physician.  

## 2014-03-03 NOTE — Progress Notes (Signed)
A/ox3, pleased with MAC, report to RN 

## 2014-03-03 NOTE — Op Note (Signed)
[facility]  [address] [place], [address]   COLONOSCOPY PROCEDURE REPORT  PATIENT: Melanie Owens, Melanie Owens  MR#: [id number] BIRTHDATE: February 16, 1970 , 57  yrs. old GENDER: female ENDOSCOPIST: Lafayette Dragon, MD REFERRED ON:GEXB PROCEDURE DATE:  03/03/2014 PROCEDURE:   Colonoscopy with snare polypectomy First Screening Colonoscopy - Avg.  risk and is 50 yrs.  old or older Yes.  Prior Negative Screening - Now for repeat screening. N/A  History of Adenoma - Now for follow-up colonoscopy & has been > or = to 3 yrs.  N/A  Polyps Removed Today? Yes. ASA CLASS:   Class I INDICATIONS:low volume painless hematochezia.  Positive family history of colon polyps. MEDICATIONS: Monitored anesthesia care and Propofol 300 mg IV  DESCRIPTION OF PROCEDURE:   After the risks benefits and alternatives of the procedure were thoroughly explained, informed consent was obtained.  The digital rectal exam revealed no abnormalities of the rectum.   The LB [id number]  endoscope was introduced through the anus and advanced to the cecum, which was identified by both the appendix and ileocecal valve. No adverse events experienced.   The quality of the prep was excellent, using MoviPrep  The instrument was then slowly withdrawn as the colon was fully examined.      COLON FINDINGS: A polypoid shaped semi-pedunculated polyp measuring 25 mm in size was found in the rectum.  A polypectomy was performed using snare cautery.  The resection was complete, the polyp tissue was completely retrieved and sent to histology.  Retroflexed views revealed no abnormalities. The time to cecum=5 minutes 31 seconds. Withdrawal time=5 minutes 49 seconds.  The scope was withdrawn and the procedure completed. COMPLICATIONS: There were no immediate complications.  ENDOSCOPIC IMPRESSION: Semi-pedunculated polyp was found in the rectum; polypectomy was performed using snare cautery  RECOMMENDATIONS: 1.  Await  pathology results 2.  Rectal polyp was likely source of the hematochezia  High fiber diet Recall colonoscopy pending path report  eSigned:  Lafayette Dragon, MD 03/03/2014 11:02 AM   cc:   PATIENT NAME:  Melanie Owens, Melanie Owens MR#: [id number]

## 2014-03-03 NOTE — Patient Instructions (Signed)
YOU HAD AN ENDOSCOPIC PROCEDURE TODAY AT THE [facility]: Refer to the procedure report that was given to you for any specific questions about what was found during the examination.  If the procedure report does not answer your questions, please call your gastroenterologist to clarify.  If you requested that your care partner not be given the details of your procedure findings, then the procedure report has been included in a sealed envelope for you to review at your convenience later.  YOU SHOULD EXPECT: Some feelings of bloating in the abdomen. Passage of more gas than usual.  Walking can help get rid of the air that was put into your GI tract during the procedure and reduce the bloating. If you had a lower endoscopy (such as a colonoscopy or flexible sigmoidoscopy) you may notice spotting of blood in your stool or on the toilet paper. If you underwent a bowel prep for your procedure, then you may not have a normal bowel movement for a few days.  DIET: Your first meal following the procedure should be a light meal and then it is ok to progress to your normal diet.  A half-sandwich or bowl of soup is an example of a good first meal.  Heavy or fried foods are harder to digest and may make you feel nauseous or bloated.  Likewise meals heavy in dairy and vegetables can cause extra gas to form and this can also increase the bloating.  Drink plenty of fluids but you should avoid alcoholic beverages for 24 hours.  ACTIVITY: Your care partner should take you home directly after the procedure.  You should plan to take it easy, moving slowly for the rest of the day.  You can resume normal activity the day after the procedure however you should NOT DRIVE or use heavy machinery for 24 hours (because of the sedation medicines used during the test).    SYMPTOMS TO REPORT IMMEDIATELY: A gastroenterologist can be reached at any hour.  During normal business hours, 8:30 AM to 5:00 PM Monday through Friday,  call [phone].  After hours and on weekends, please call the GI answering service at [phone] who will take a message and have the physician on call contact you.   Following lower endoscopy (colonoscopy or flexible sigmoidoscopy):  Excessive amounts of blood in the stool  Significant tenderness or worsening of abdominal pains  Swelling of the abdomen that is new, acute  Fever of 100F or higher   FOLLOW UP: If any biopsies were taken you will be contacted by phone or by letter within the next 1-3 weeks.  Call your gastroenterologist if you have not heard about the biopsies in 3 weeks.  Our staff will call the home number listed on your records the next business day following your procedure to check on you and address any questions or concerns that you may have at that time regarding the information given to you following your procedure. This is a courtesy call and so if there is no answer at the home number and we have not heard from you through the emergency physician on call, we will assume that you have returned to your regular daily activities without incident.  SIGNATURES/CONFIDENTIALITY: You and/or your care partner have signed paperwork which will be entered into your electronic medical record.  These signatures attest to the fact that that the information above on your After Visit Summary has been reviewed and is understood.  Full responsibility of the confidentiality of   this discharge information lies with you and/or your care-partner.   Resume medications. Information given on polyps and high fiber diet with discharge instructions. 

## 2014-03-04 ENCOUNTER — Telehealth: Payer: Self-pay

## 2014-03-04 NOTE — Telephone Encounter (Signed)
  Follow up Call-  Call back number 03/03/2014  Post procedure Call Back phone  # [phone]  Permission to leave phone message Yes     Patient questions:  Do you have a fever, pain , or abdominal swelling? No. Pain Score  0 *  Have you tolerated food without any problems? Yes.    Have you been able to return to your normal activities? Yes.    Do you have any questions about your discharge instructions: Diet   No. Medications  No. Follow up visit  No.  Do you have questions or concerns about your Care? No.  Actions: * If pain score is 4 or above: No action needed, pain <4.

## 2014-03-10 ENCOUNTER — Encounter: Payer: Self-pay | Admitting: Internal Medicine

## 2014-03-16 ENCOUNTER — Telehealth: Payer: Self-pay | Admitting: Internal Medicine

## 2014-03-17 NOTE — Telephone Encounter (Signed)
Patient given results as per letter on 03/10/14.

## 2014-04-01 ENCOUNTER — Other Ambulatory Visit: Payer: Self-pay | Admitting: Gynecology

## 2014-04-01 DIAGNOSIS — N6001 Solitary cyst of right breast: Secondary | ICD-10-CM

## 2014-04-21 ENCOUNTER — Other Ambulatory Visit: Payer: Self-pay | Admitting: Gynecology

## 2014-04-21 ENCOUNTER — Other Ambulatory Visit: Payer: Self-pay

## 2014-04-21 DIAGNOSIS — N6001 Solitary cyst of right breast: Secondary | ICD-10-CM

## 2014-04-22 ENCOUNTER — Ambulatory Visit
Admission: RE | Admit: 2014-04-22 | Discharge: 2014-04-22 | Disposition: A | Discharge status: Home / Self Care | Payer: 59 | Source: Ambulatory Visit | Attending: Gynecology | Admitting: Gynecology

## 2014-04-22 DIAGNOSIS — N6001 Solitary cyst of right breast: Secondary | ICD-10-CM

## 2014-07-23 ENCOUNTER — Other Ambulatory Visit: Payer: Self-pay | Admitting: Gynecology

## 2014-07-23 NOTE — Telephone Encounter (Signed)
Needs annual exam in August or September this year

## 2014-07-23 NOTE — Telephone Encounter (Signed)
RGCE was due in April 2016.  Nothing currently scheduled.

## 2014-07-23 NOTE — Telephone Encounter (Signed)
Called into pharmacy

## 2014-09-26 ENCOUNTER — Emergency Department (HOSPITAL_COMMUNITY)
Admission: EM | Admit: 2014-09-26 | Discharge: 2014-09-26 | Disposition: A | Discharge status: Home / Self Care | Payer: Self-pay | Attending: Emergency Medicine | Admitting: Emergency Medicine

## 2014-09-26 ENCOUNTER — Encounter (HOSPITAL_COMMUNITY): Payer: Self-pay | Admitting: *Deleted

## 2014-09-26 ENCOUNTER — Emergency Department (HOSPITAL_COMMUNITY): Payer: 59

## 2014-09-26 DIAGNOSIS — J159 Unspecified bacterial pneumonia: Secondary | ICD-10-CM | POA: Insufficient documentation

## 2014-09-26 DIAGNOSIS — Z3202 Encounter for pregnancy test, result negative: Secondary | ICD-10-CM | POA: Insufficient documentation

## 2014-09-26 DIAGNOSIS — Z8639 Personal history of other endocrine, nutritional and metabolic disease: Secondary | ICD-10-CM | POA: Insufficient documentation

## 2014-09-26 DIAGNOSIS — Z79899 Other long term (current) drug therapy: Secondary | ICD-10-CM | POA: Insufficient documentation

## 2014-09-26 DIAGNOSIS — J189 Pneumonia, unspecified organism: Secondary | ICD-10-CM

## 2014-09-26 DIAGNOSIS — F329 Major depressive disorder, single episode, unspecified: Secondary | ICD-10-CM | POA: Insufficient documentation

## 2014-09-26 DIAGNOSIS — Z720 Tobacco use: Secondary | ICD-10-CM | POA: Insufficient documentation

## 2014-09-26 LAB — COMPREHENSIVE METABOLIC PANEL
ALT: 16 U/L (ref 14–54)
AST: 22 U/L (ref 15–41)
Albumin: 3.4 g/dL — ABNORMAL LOW (ref 3.5–5.0)
Alkaline Phosphatase: 77 U/L (ref 38–126)
Anion gap: 11 (ref 5–15)
BILIRUBIN TOTAL: 0.6 mg/dL (ref 0.3–1.2)
BUN: 14 mg/dL (ref 6–20)
CO2: 23 mmol/L (ref 22–32)
Calcium: 9 mg/dL (ref 8.9–10.3)
Chloride: 101 mmol/L (ref 101–111)
Creatinine, Ser: 0.75 mg/dL (ref 0.44–1.00)
GFR calc Af Amer: >60 mL/min (ref >60)
GFR calc non Af Amer: >60 mL/min (ref >60)
Glucose, Bld: 108 mg/dL — ABNORMAL HIGH (ref 65–99)
POTASSIUM: 3.8 mmol/L (ref 3.5–5.1)
SODIUM: 135 mmol/L (ref 135–145)
Total Protein: 6 g/dL — ABNORMAL LOW (ref 6.5–8.1)

## 2014-09-26 LAB — URINALYSIS, ROUTINE W REFLEX MICROSCOPIC
Bilirubin Urine: NEGATIVE
Glucose, UA: NEGATIVE mg/dL
Hgb urine dipstick: NEGATIVE
Ketones, ur: NEGATIVE mg/dL
LEUKOCYTES UA: NEGATIVE
Nitrite: NEGATIVE
PROTEIN: 30 mg/dL — AB
Specific Gravity, Urine: 1.024 (ref 1.005–1.030)
Urobilinogen, UA: 1 mg/dL (ref 0.0–1.0)
pH: 7.5 (ref 5.0–8.0)

## 2014-09-26 LAB — URINE MICROSCOPIC-ADD ON

## 2014-09-26 LAB — CBC
HCT: 41.7 % (ref 36.0–46.0)
Hemoglobin: 14.8 g/dL (ref 12.0–15.0)
MCH: 32.8 pg (ref 26.0–34.0)
MCHC: 35.5 g/dL (ref 30.0–36.0)
MCV: 92.5 fL (ref 78.0–100.0)
Platelets: 141 10*3/uL — ABNORMAL LOW (ref 150–400)
RBC: 4.51 MIL/uL (ref 3.87–5.11)
RDW: 12.4 % (ref 11.5–15.5)
WBC: 17.4 10*3/uL — ABNORMAL HIGH (ref 4.0–10.5)

## 2014-09-26 LAB — I-STAT CG4 LACTIC ACID, ED: LACTIC ACID, VENOUS: 1.66 mmol/L (ref 0.5–2.0)

## 2014-09-26 LAB — PREGNANCY, URINE: PREG TEST UR: NEGATIVE

## 2014-09-26 MED ORDER — SODIUM CHLORIDE 0.9 % IV BOLUS (SEPSIS)
1000.0000 mL | Freq: Once | INTRAVENOUS | Status: AC
  Administered 2014-09-26: 1000 mL via INTRAVENOUS

## 2014-09-26 MED ORDER — AZITHROMYCIN 500 MG PO TABS: 500.0000 mg | ORAL_TABLET | Freq: Every day | ORAL | Status: DC

## 2014-09-26 MED ORDER — AMOXICILLIN 500 MG PO CAPS
1000.0000 mg | ORAL_CAPSULE | Freq: Three times a day (TID) | ORAL | Status: AC
Start: 2014-09-26 — End: 2014-09-30

## 2014-09-26 MED ORDER — DEXTROSE 5 % IV SOLN
500.0000 mg | Freq: Once | INTRAVENOUS | Status: AC
  Administered 2014-09-26: 500 mg via INTRAVENOUS
  Filled 2014-09-26: qty 500

## 2014-09-26 MED ORDER — TRAMADOL HCL 50 MG PO TABS
50.0000 mg | ORAL_TABLET | Freq: Once | ORAL | Status: AC
  Administered 2014-09-26: 50 mg via ORAL
  Filled 2014-09-26: qty 1

## 2014-09-26 MED ORDER — DEXTROSE 5 % IV SOLN
1.0000 g | Freq: Once | INTRAVENOUS | Status: AC
  Administered 2014-09-26: 1 g via INTRAVENOUS
  Filled 2014-09-26: qty 10

## 2014-09-26 MED ORDER — ACETAMINOPHEN 500 MG PO TABS
1000.0000 mg | ORAL_TABLET | Freq: Once | ORAL | Status: AC
  Administered 2014-09-26: 1000 mg via ORAL
  Filled 2014-09-26: qty 2

## 2014-09-26 NOTE — ED Notes (Signed)
Pt reports onset yesterday of bodyaches, lower back pain and fever of 104. Denies urinary symptoms.

## 2014-09-26 NOTE — ED Provider Notes (Signed)
CSN: [id number]     Arrival date & time 09/26/14  1027 History   First MD Initiated Contact with Patient 09/26/14 [phone]     Chief Complaint  Patient presents with  . Back Pain  . Fever     (Consider location/radiation/quality/duration/timing/severity/associated sxs/prior Treatment) Patient is a 44 y.o. female presenting with back pain and fever. The history is provided by the patient.  Back Pain Associated symptoms: fever   Associated symptoms: no abdominal pain, no chest pain, no dysuria and no headaches   Fever Associated symptoms: cough and vomiting   Associated symptoms: no chest pain, no chills, no confusion, no congestion, no diarrhea, no dysuria, no headaches, no rash and no sore throat   Patient c/o low back pain onset yesterday, along w fevers, general malaise. Fever at home 104. No gu c/o, no dysuria. No abd pain. No vaginal bleeding or discharge. Occasional non productive cough. No sore throat or runny nose. No sinus drainage or pain. One episode yellowish emesis thisd am. No diarrhea. No recent known ill contacts. No rash. No tick/bites.  Remote hx uti.      Past Medical History  Diagnosis Date  . Depression   . Premature ovarian failure    Past Surgical History  Procedure Laterality Date  . Wisdom tooth extraction    . Laparoscopic bilateral salpingectomy Bilateral 09/28/2013    Procedure: LAPAROSCOPIC BILATERAL SALPINGECTOMY; PELVIC WASHINGS;  Surgeon: Terrance Mass, MD;  Location: [facility];  Service: Gynecology;  Laterality: Bilateral;   Family History  Problem Relation Age of Onset  . Breast cancer Mother 86  . Thyroid cancer Father 25  . Hypertension Father   . Thyroid cancer Sister 36  . Breast cancer Paternal Grandmother   . Cervical cancer Daughter 50  . Cancer Sister     8's  . Colon polyps Father    Social History  Substance Use Topics  . Smoking status: Current Every Day Smoker -- 0.50 packs/day for 25 years    Types: Cigarettes  . Smokeless  tobacco: Never Used  . Alcohol Use: No   OB History    Gravida Para Term Preterm AB TAB SAB Ectopic Multiple Living   4 3 3  1  1   3      Review of Systems  Constitutional: Positive for fever. Negative for chills.  HENT: Negative for congestion and sore throat.   Eyes: Negative for redness.  Respiratory: Positive for cough. Negative for shortness of breath.   Cardiovascular: Negative for chest pain.  Gastrointestinal: Positive for vomiting. Negative for abdominal pain and diarrhea.  Genitourinary: Negative for dysuria, flank pain, vaginal bleeding and vaginal discharge.  Musculoskeletal: Positive for back pain. Negative for neck pain.  Skin: Negative for rash.  Neurological: Negative for headaches.  Hematological: Does not bruise/bleed easily.  Psychiatric/Behavioral: Negative for confusion.      Allergies  Review of patient's allergies indicates no known allergies.  Home Medications   Prior to Admission medications   Medication Sig Start Date End Date Taking? Authorizing Provider  escitalopram (LEXAPRO) 10 MG tablet Take 1 tablet (10 mg total) by mouth daily. Patient not taking: Reported on 03/03/2014 02/11/14   Huel Cote, NP  fluconazole (DIFLUCAN) 150 MG tablet Take 1 tablet (150 mg total) by mouth once. Patient not taking: Reported on 03/03/2014 02/11/14   Huel Cote, NP  ibuprofen (ADVIL,MOTRIN) 800 MG tablet Take 1 tablet (800 mg total) by mouth 3 (three) times daily. Patient not taking: Reported  on 03/03/2014 09/29/13   Terrance Mass, MD  metroNIDAZOLE (METROGEL VAGINAL) 0.75 % vaginal gel 1 applicator per vagina at HS x 5 Patient not taking: Reported on 03/03/2014 02/11/14   Huel Cote, NP  venlafaxine XR (EFFEXOR-XR) 75 MG 24 hr capsule Take 75 mg by mouth daily with breakfast.    Historical Provider, MD  zolpidem (AMBIEN) 10 MG tablet TAKE ONE TABLET BY MOUTH AT BEDTIME AS NEEDED FOR SLEEP 07/23/14   Terrance Mass, MD   BP 103/52 mmHg  Pulse 108   Temp(Src) 99.4 F (37.4 C) (Oral)  Resp 18  Ht 5\' 4"  (1.626 m)  Wt 149 lb 6.4 oz (67.767 kg)  BMI 25.63 kg/m2  SpO2 98%  LMP 04/04/2011 Physical Exam  Constitutional: She appears well-developed and well-nourished. No distress.  HENT:  Nose: Nose normal.  Mouth/Throat: Oropharynx is clear and moist.  Eyes: Conjunctivae are normal. No scleral icterus.  Neck: Neck supple. No tracheal deviation present.  No stiffness or rigidity  Cardiovascular: Regular rhythm, normal heart sounds and intact distal pulses.  Exam reveals no gallop and no friction rub.   No murmur heard. Pulmonary/Chest: Effort normal. No respiratory distress.  Rhonchi left.   Abdominal: Soft. Normal appearance and bowel sounds are normal. She exhibits no distension and no mass. There is no tenderness. There is no rebound and no guarding.  Genitourinary:  No cva tenderness  Musculoskeletal: She exhibits no edema.  TLS spine non tender, aligned.   Lymphadenopathy:    She has no cervical adenopathy.  Neurological: She is alert.  Skin: Skin is warm and dry. No rash noted. She is not diaphoretic.  Psychiatric: She has a normal mood and affect.  Nursing note and vitals reviewed.   ED Course  Procedures (including critical care time) Labs Review  Results for orders placed or performed during the hospital encounter of 09/26/14  CBC  Result Value Ref Range   WBC 17.4 (H) 4.0 - 10.5 K/uL   RBC 4.51 3.87 - 5.11 MIL/uL   Hemoglobin 14.8 12.0 - 15.0 g/dL   HCT 41.7 36.0 - 46.0 %   MCV 92.5 78.0 - 100.0 fL   MCH 32.8 26.0 - 34.0 pg   MCHC 35.5 30.0 - 36.0 g/dL   RDW 12.4 11.5 - 15.5 %   Platelets 141 (L) 150 - 400 K/uL  Comprehensive metabolic panel  Result Value Ref Range   Sodium 135 135 - 145 mmol/L   Potassium 3.8 3.5 - 5.1 mmol/L   Chloride 101 101 - 111 mmol/L   CO2 23 22 - 32 mmol/L   Glucose, Bld 108 (H) 65 - 99 mg/dL   BUN 14 6 - 20 mg/dL   Creatinine, Ser 0.75 0.44 - 1.00 mg/dL   Calcium 9.0 8.9 -  10.3 mg/dL   Total Protein 6.0 (L) 6.5 - 8.1 g/dL   Albumin 3.4 (L) 3.5 - 5.0 g/dL   AST 22 15 - 41 U/L   ALT 16 14 - 54 U/L   Alkaline Phosphatase 77 38 - 126 U/L   Total Bilirubin 0.6 0.3 - 1.2 mg/dL   GFR calc non Af Amer >60 >60 mL/min   GFR calc Af Amer >60 >60 mL/min   Anion gap 11 5 - 15  Urinalysis, Routine w reflex microscopic (not at [facility])  Result Value Ref Range   Color, Urine YELLOW YELLOW   APPearance CLEAR CLEAR   Specific Gravity, Urine 1.024 1.005 - 1.030   pH 7.5  5.0 - 8.0   Glucose, UA NEGATIVE NEGATIVE mg/dL   Hgb urine dipstick NEGATIVE NEGATIVE   Bilirubin Urine NEGATIVE NEGATIVE   Ketones, ur NEGATIVE NEGATIVE mg/dL   Protein, ur 30 (A) NEGATIVE mg/dL   Urobilinogen, UA 1.0 0.0 - 1.0 mg/dL   Nitrite NEGATIVE NEGATIVE   Leukocytes, UA NEGATIVE NEGATIVE  Pregnancy, urine  Result Value Ref Range   Preg Test, Ur NEGATIVE NEGATIVE  Urine microscopic-add on  Result Value Ref Range   Squamous Epithelial / LPF MANY (A) RARE   WBC, UA 0-2 <3 WBC/hpf   Bacteria, UA FEW (A) RARE   Urine-Other MUCOUS PRESENT   I-Stat CG4 Lactic Acid, ED  Result Value Ref Range   Lactic Acid, Venous 1.66 0.5 - 2.0 mmol/L   Dg Chest 2 View  09/26/2014   CLINICAL DATA:  44 year old female with severe back pain, fever, chills, nausea, vomiting, cough. Initial encounter. Smoker.  EXAM: CHEST  2 VIEW  COMPARISON:  12/21/2002.  FINDINGS: Lung volumes remain within normal limits. Normal cardiac size and mediastinal contours. Visualized tracheal air column is within normal limits. No pneumothorax, pulmonary edema, pleural effusion or confluent pulmonary opacity. Increased interstitial markings in both lungs since 2004. No acute osseous abnormality identified. Negative visible bowel gas pattern.  IMPRESSION: Increased pulmonary interstitial markings might be chronic due to smoking, but consider viral/ atypical respiratory infection in this setting. No pleural effusion.   Electronically Signed    By: Genevie Ann M.D.   On: 09/26/2014 10:31      I, Joretta Eads E, personally reviewed and evaluated these images and lab results as part of my medical decision-making.    MDM   Iv ns. Labs.  Reviewed nursing notes and prior charts for additional history.   Pt w cough, rhonchi on exam, fever.  cxr w infil, wbc elev.  Will rx for possible cap.  Rocephin and zithromax iv.  Iv and po fluids.  Tylenol for fever.   Tolerating po fluids. No increased wob. Vitals normal. Pt currently appears stable for d/c.    Lajean Saver, MD 09/26/14 1143

## 2014-09-26 NOTE — Discharge Instructions (Signed)
It was our pleasure to provide your ER care today - we hope that you feel better.  Rest. Drink plenty of fluids.  Take antibiotics as prescribed.  Avoid smoking.  Take tylenol/advil as need for fever.  Follow up with primary care doctor in 3-4 days if symptoms fail to improve/resolve.  Return to ER if worse, new symptoms, trouble breathing, worsening or severe pain, weak/fainting, persistent vomiting, other concern.  You were given pain medication in the ER - no driving for the next 4 hours.    Fever, Adult A fever is a higher than normal body temperature. In an adult, an oral temperature around 98.6 F (37 C) is considered normal. A temperature of 100.4 F (38 C) or higher is generally considered a fever. Mild or moderate fevers generally have no long-term effects and often do not require treatment. Extreme fever (greater than or equal to 106 F or 41.1 C) can cause seizures. The sweating that may occur with repeated or prolonged fever may cause dehydration. Elderly people can develop confusion during a fever. A measured temperature can vary with:  Age.  Time of day.  Method of measurement (mouth, underarm, rectal, or ear). The fever is confirmed by taking a temperature with a thermometer. Temperatures can be taken different ways. Some methods are accurate and some are not.  An oral temperature is used most commonly. Electronic thermometers are fast and accurate.  An ear temperature will only be accurate if the thermometer is positioned as recommended by the manufacturer.  A rectal temperature is accurate and done for those adults who have a condition where an oral temperature cannot be taken.  An underarm (axillary) temperature is not accurate and not recommended. Fever is a symptom, not a disease.  CAUSES   Infections commonly cause fever.  Some noninfectious causes for fever include:  Some arthritis conditions.  Some thyroid or adrenal gland conditions.  Some  immune system conditions.  Some types of cancer.  A medicine reaction.  High doses of certain street drugs such as methamphetamine.  Dehydration.  Exposure to high outside or room temperatures.  Occasionally, the source of a fever cannot be determined. This is sometimes called a "fever of unknown origin" (FUO).  Some situations may lead to a temporary rise in body temperature that may go away on its own. Examples are:  Childbirth.  Surgery.  Intense exercise. HOME CARE INSTRUCTIONS   Take appropriate medicines for fever. Follow dosing instructions carefully. If you use acetaminophen to reduce the fever, be careful to avoid taking other medicines that also contain acetaminophen. Do not take aspirin for a fever if you are younger than age 64. There is an association with Reye's syndrome. Reye's syndrome is a rare but potentially deadly disease.  If an infection is present and antibiotics have been prescribed, take them as directed. Finish them even if you start to feel better.  Rest as needed.  Maintain an adequate fluid intake. To prevent dehydration during an illness with prolonged or recurrent fever, you may need to drink extra fluid.Drink enough fluids to keep your urine clear or pale yellow.  Sponging or bathing with room temperature water may help reduce body temperature. Do not use ice water or alcohol sponge baths.  Dress comfortably, but do not over-bundle. SEEK MEDICAL CARE IF:   You are unable to keep fluids down.  You develop vomiting or diarrhea.  You are not feeling at least partly better after 3 days.  You develop new symptoms or  problems. SEEK IMMEDIATE MEDICAL CARE IF:   You have shortness of breath or trouble breathing.  You develop excessive weakness.  You are dizzy or you faint.  You are extremely thirsty or you are making little or no urine.  You develop new pain that was not there before (such as in the head, neck, chest, back, or  abdomen).  You have persistent vomiting and diarrhea for more than 1 to 2 days.  You develop a stiff neck or your eyes become sensitive to light.  You develop a skin rash.  You have a fever or persistent symptoms for more than 2 to 3 days.  You have a fever and your symptoms suddenly get worse. MAKE SURE YOU:   Understand these instructions.  Will watch your condition.  Will get help right away if you are not doing well or get worse. Document Released: 07/25/2000 Document Revised: 06/15/2013 Document Reviewed: 11/30/2010 [facility] Patient Information 2015 [place], [place]. This information is not intended to replace advice given to you by your health care provider. Make sure you discuss any questions you have with your health care provider.    Pneumonia Pneumonia is an infection of the lungs.  CAUSES Pneumonia may be caused by bacteria or a virus. Usually, these infections are caused by breathing infectious particles into the lungs (respiratory tract). SIGNS AND SYMPTOMS   Cough.  Fever.  Chest pain.  Increased rate of breathing.  Wheezing.  Mucus production. DIAGNOSIS  If you have the common symptoms of pneumonia, your health care provider will typically confirm the diagnosis with a chest X-ray. The X-ray will show an abnormality in the lung (pulmonary infiltrate) if you have pneumonia. Other tests of your blood, urine, or sputum may be done to find the specific cause of your pneumonia. Your health care provider may also do tests (blood gases or pulse oximetry) to see how well your lungs are working. TREATMENT  Some forms of pneumonia may be spread to other people when you cough or sneeze. You may be asked to wear a mask before and during your exam. Pneumonia that is caused by bacteria is treated with antibiotic medicine. Pneumonia that is caused by the influenza virus may be treated with an antiviral medicine. Most other viral infections must run their course. These  infections will not respond to antibiotics.  HOME CARE INSTRUCTIONS   Cough suppressants may be used if you are losing too much rest. However, coughing protects you by clearing your lungs. You should avoid using cough suppressants if you can.  Your health care provider may have prescribed medicine if he or she thinks your pneumonia is caused by bacteria or influenza. Finish your medicine even if you start to feel better.  Your health care provider may also prescribe an expectorant. This loosens the mucus to be coughed up.  Take medicines only as directed by your health care provider.  Do not smoke. Smoking is a common cause of bronchitis and can contribute to pneumonia. If you are a smoker and continue to smoke, your cough may last several weeks after your pneumonia has cleared.  A cold steam vaporizer or humidifier in your room or home may help loosen mucus.  Coughing is often worse at night. Sleeping in a semi-upright position in a recliner or using a couple pillows under your head will help with this.  Get rest as you feel it is needed. Your body will usually let you know when you need to rest. PREVENTION A pneumococcal shot (  vaccine) is available to prevent a common bacterial cause of pneumonia. This is usually suggested for:  People over 20 years old.  Patients on chemotherapy.  People with chronic lung problems, such as bronchitis or emphysema.  People with immune system problems. If you are over 65 or have a high risk condition, you may receive the pneumococcal vaccine if you have not received it before. In some countries, a routine influenza vaccine is also recommended. This vaccine can help prevent some cases of pneumonia.You may be offered the influenza vaccine as part of your care. If you smoke, it is time to quit. You may receive instructions on how to stop smoking. Your health care provider can provide medicines and counseling to help you quit. SEEK MEDICAL CARE IF: You  have a fever. SEEK IMMEDIATE MEDICAL CARE IF:   Your illness becomes worse. This is especially true if you are elderly or weakened from any other disease.  You cannot control your cough with suppressants and are losing sleep.  You begin coughing up blood.  You develop pain which is getting worse or is uncontrolled with medicines.  Any of the symptoms which initially brought you in for treatment are getting worse rather than better.  You develop shortness of breath or chest pain. MAKE SURE YOU:   Understand these instructions.  Will watch your condition.  Will get help right away if you are not doing well or get worse. Document Released: 01/29/2005 Document Revised: 06/15/2013 Document Reviewed: 04/20/2010 [facility] Patient Information 2015 [place], [place]. This information is not intended to replace advice given to you by your health care provider. Make sure you discuss any questions you have with your health care provider.    Smoking Hazards Smoking cigarettes is extremely bad for your health. Tobacco smoke has over 200 known poisons in it. It contains the poisonous gases nitrogen oxide and carbon monoxide. There are over 60 chemicals in tobacco smoke that cause cancer. Some of the chemicals found in cigarette smoke include:   Cyanide.   Benzene.   Formaldehyde.   Methanol (wood alcohol).   Acetylene (fuel used in welding torches).   Ammonia.  Even smoking lightly shortens your life expectancy by several years. You can greatly reduce the risk of medical problems for you and your family by stopping now. Smoking is the most preventable cause of death and disease in our society. Within days of quitting smoking, your circulation improves, you decrease the risk of having a heart attack, and your lung capacity improves. There may be some increased phlegm in the first few days after quitting, and it may take months for your lungs to clear up completely. Quitting for 10 years  reduces your risk of developing lung cancer to almost that of a nonsmoker.  WHAT ARE THE RISKS OF SMOKING? Cigarette smokers have an increased risk of many serious medical problems, including:  Lung cancer.   Lung disease (such as pneumonia, bronchitis, and emphysema).   Heart attack and chest pain due to the heart not getting enough oxygen (angina).   Heart disease and peripheral blood vessel disease.   Hypertension.   Stroke.   Oral cancer (cancer of the lip, mouth, or voice box).   Bladder cancer.   Pancreatic cancer.   Cervical cancer.   Pregnancy complications, including premature birth.   Stillbirths and smaller newborn babies, birth defects, and genetic damage to sperm.   Early menopause.   Lower estrogen level for women.   Infertility.   Facial wrinkles.  Blindness.   Increased risk of broken bones (fractures).   Senile dementia.   Stomach ulcers and internal bleeding.   Delayed wound healing and increased risk of complications during surgery. Because of secondhand smoke exposure, children of smokers have an increased risk of the following:   Sudden infant death syndrome (SIDS).   Respiratory infections.   Lung cancer.   Heart disease.   Ear infections.  WHY IS SMOKING ADDICTIVE? Nicotine is the chemical agent in tobacco that is capable of causing addiction or dependence. When you smoke and inhale, nicotine is absorbed rapidly into the bloodstream through your lungs. Both inhaled and noninhaled nicotine may be addictive.  WHAT ARE THE BENEFITS OF QUITTING?  There are many health benefits to quitting smoking. Some are:   The likelihood of developing cancer and heart disease decreases. Health improvements are seen almost immediately.   Blood pressure, pulse rate, and breathing patterns start returning to normal soon after quitting.   People who quit may see an improvement in their overall quality of life.  HOW DO YOU  QUIT SMOKING? Smoking is an addiction with both physical and psychological effects, and longtime habits can be hard to change. Your health care provider can recommend:  Programs and community resources, which may include group support, education, or therapy.  Replacement products, such as patches, gum, and nasal sprays. Use these products only as directed. Do not replace cigarette smoking with electronic cigarettes (commonly called e-cigarettes). The safety of e-cigarettes is unknown, and some may contain harmful chemicals. FOR MORE INFORMATION  American Lung Association: www.lung.org  American Cancer Society: www.cancer.org Document Released: 03/08/2004 Document Revised: 11/19/2012 Document Reviewed: 07/21/2012 [facility] Patient Information 2015 [place], [place]. This information is not intended to replace advice given to you by your health care provider. Make sure you discuss any questions you have with your health care provider.

## 2014-11-09 ENCOUNTER — Other Ambulatory Visit: Payer: Self-pay | Admitting: Obstetrics and Gynecology

## 2014-11-09 DIAGNOSIS — N6310 Unspecified lump in the right breast, unspecified quadrant: Secondary | ICD-10-CM

## 2014-11-29 ENCOUNTER — Other Ambulatory Visit: Payer: 59

## 2014-12-01 ENCOUNTER — Other Ambulatory Visit (HOSPITAL_COMMUNITY): Payer: Self-pay | Admitting: *Deleted

## 2014-12-01 DIAGNOSIS — N6310 Unspecified lump in the right breast, unspecified quadrant: Secondary | ICD-10-CM

## 2014-12-23 ENCOUNTER — Encounter (HOSPITAL_COMMUNITY): Payer: Self-pay

## 2014-12-23 ENCOUNTER — Ambulatory Visit (HOSPITAL_COMMUNITY)
Admission: RE | Admit: 2014-12-23 | Discharge: 2014-12-23 | Disposition: A | Discharge status: Home / Self Care | Payer: 59 | Source: Ambulatory Visit | Attending: Obstetrics and Gynecology | Admitting: Obstetrics and Gynecology

## 2014-12-23 VITALS — BP 120/78 | Temp 97.9°F | Ht 64.0 in | Wt 150.0 lb

## 2014-12-23 DIAGNOSIS — Z1239 Encounter for other screening for malignant neoplasm of breast: Secondary | ICD-10-CM

## 2014-12-23 NOTE — Patient Instructions (Addendum)
Educational materials on self breast awareness given. Explained to Melanie Owens that she did not need a Pap smear today due to last Pap smear was 10/28/2012. Let her know [facility] will cover Pap smears every 3 years unless has a history of abnormal Pap smears. Reminded patient that her next Pap smear is due next year. Referred patient to the [facility] for diagnostic mammogram per recommendation. Appointment scheduled for Friday, December 24, 2014 at 1400. Patient aware of appointment and will reschedule due to a conflict with her work schedule. Charmayne Sheer Rinker verbalized understanding.  Michah Minton, Arvil Chaco, RN 4:35 PM

## 2014-12-23 NOTE — Progress Notes (Signed)
No complaints today.  Pap Smear:  Pap smear not completed today. Last Pap smear was 10/28/2012 at the [facility] and normal. Per patient has no history of an abnormal Pap smear. Last Pap smear result is in EPIC.  Physical exam: Breasts Breasts symmetrical. No skin abnormalities bilateral breasts. No nipple retraction bilateral breasts. No nipple discharge bilateral breasts. No lymphadenopathy. No lumps palpated bilateral breasts. No complaints of pain or tenderness on exam. Referred patient to the [facility] for diagnostic mammogram per recommendation. Appointment scheduled for Friday, December 24, 2014 at 1400.   Pelvic/Bimanual No Pap smear completed today since last Pap smear was 10/28/2012. Pap smear not indicated per BCCCP guidelines.

## 2014-12-24 ENCOUNTER — Other Ambulatory Visit: Payer: 59

## 2015-01-03 ENCOUNTER — Ambulatory Visit
Admission: RE | Admit: 2015-01-03 | Discharge: 2015-01-03 | Disposition: A | Discharge status: Home / Self Care | Payer: No Typology Code available for payment source | Source: Ambulatory Visit | Attending: Obstetrics and Gynecology | Admitting: Obstetrics and Gynecology

## 2015-01-03 DIAGNOSIS — N6310 Unspecified lump in the right breast, unspecified quadrant: Secondary | ICD-10-CM

## 2015-01-13 ENCOUNTER — Ambulatory Visit
Admission: RE | Admit: 2015-01-13 | Discharge: 2015-01-13 | Disposition: A | Discharge status: Home / Self Care | Payer: No Typology Code available for payment source | Source: Ambulatory Visit | Attending: Obstetrics and Gynecology | Admitting: Obstetrics and Gynecology

## 2015-03-31 ENCOUNTER — Telehealth: Payer: Self-pay

## 2015-03-31 NOTE — Telephone Encounter (Signed)
Called to remind of appointment and to be fasting.

## 2015-04-01 ENCOUNTER — Ambulatory Visit: Payer: Self-pay | Admitting: Women's Health

## 2015-04-01 ENCOUNTER — Ambulatory Visit: Payer: Self-pay

## 2015-04-01 ENCOUNTER — Other Ambulatory Visit: Payer: Self-pay

## 2015-04-27 ENCOUNTER — Other Ambulatory Visit: Payer: Self-pay | Admitting: Gynecology

## 2015-04-30 ENCOUNTER — Other Ambulatory Visit: Payer: Self-pay | Admitting: Gynecology

## 2015-06-10 ENCOUNTER — Other Ambulatory Visit: Payer: Self-pay | Admitting: Gynecology

## 2015-06-10 ENCOUNTER — Telehealth: Payer: Self-pay | Admitting: *Deleted

## 2015-06-10 MED ORDER — ZOLPIDEM TARTRATE 10 MG PO TABS: 10.0000 mg | ORAL_TABLET | Freq: Every evening | ORAL | Status: DC | PRN

## 2015-06-10 NOTE — Telephone Encounter (Signed)
Pt has annual scheduled on 07/19/15, requesting refill on Ambien 10 mg. Last filled in June 2016 with 3 refills.  Okay to refill? Please advise

## 2015-06-10 NOTE — Telephone Encounter (Signed)
Please call in 30 tablets with 2 refills

## 2015-06-10 NOTE — Telephone Encounter (Signed)
Pt informed with the below, rx called in

## 2015-07-29 ENCOUNTER — Encounter: Payer: Self-pay | Admitting: Women's Health

## 2016-01-25 ENCOUNTER — Other Ambulatory Visit: Payer: Self-pay | Admitting: Obstetrics and Gynecology

## 2016-01-25 DIAGNOSIS — Z1231 Encounter for screening mammogram for malignant neoplasm of breast: Secondary | ICD-10-CM

## 2016-02-16 ENCOUNTER — Ambulatory Visit: Payer: Self-pay

## 2016-02-16 ENCOUNTER — Ambulatory Visit (HOSPITAL_COMMUNITY)
Admission: RE | Admit: 2016-02-16 | Discharge: 2016-02-16 | Disposition: A | Discharge status: Home / Self Care | Payer: Self-pay | Source: Ambulatory Visit | Attending: Obstetrics and Gynecology | Admitting: Obstetrics and Gynecology

## 2016-02-16 NOTE — Progress Notes (Signed)
Patient no showed appointment

## 2016-02-22 ENCOUNTER — Other Ambulatory Visit (HOSPITAL_COMMUNITY): Payer: Self-pay | Admitting: *Deleted

## 2016-02-22 ENCOUNTER — Other Ambulatory Visit (HOSPITAL_COMMUNITY): Payer: Self-pay | Admitting: Obstetrics and Gynecology

## 2016-02-22 DIAGNOSIS — N6310 Unspecified lump in the right breast, unspecified quadrant: Secondary | ICD-10-CM

## 2016-03-01 ENCOUNTER — Other Ambulatory Visit: Payer: Self-pay

## 2016-03-01 ENCOUNTER — Ambulatory Visit (HOSPITAL_COMMUNITY): Payer: Self-pay

## 2016-03-22 ENCOUNTER — Ambulatory Visit
Admission: RE | Admit: 2016-03-22 | Discharge: 2016-03-22 | Disposition: A | Discharge status: Home / Self Care | Payer: No Typology Code available for payment source | Source: Ambulatory Visit | Attending: Obstetrics and Gynecology | Admitting: Obstetrics and Gynecology

## 2016-03-22 ENCOUNTER — Ambulatory Visit (HOSPITAL_COMMUNITY)
Admission: RE | Admit: 2016-03-22 | Discharge: 2016-03-22 | Disposition: A | Discharge status: Home / Self Care | Payer: Self-pay | Source: Ambulatory Visit | Attending: Obstetrics and Gynecology | Admitting: Obstetrics and Gynecology

## 2016-03-22 ENCOUNTER — Encounter (HOSPITAL_COMMUNITY): Payer: Self-pay

## 2016-03-22 VITALS — BP 120/74 | Temp 97.7°F | Ht 64.0 in | Wt 148.4 lb

## 2016-03-22 DIAGNOSIS — N6310 Unspecified lump in the right breast, unspecified quadrant: Secondary | ICD-10-CM

## 2016-03-22 DIAGNOSIS — Z01419 Encounter for gynecological examination (general) (routine) without abnormal findings: Secondary | ICD-10-CM

## 2016-03-22 NOTE — Progress Notes (Signed)
Pap smear completed

## 2016-03-22 NOTE — Patient Instructions (Addendum)
Explained breast self awareness with Melanie Owens. Let patient know [facility] will cover Pap smears and HPV typing every 5 years unless has a history of abnormal Pap smears. Referred patient to the [facility] for a diagnostic mammogram and possible right breast ultrasound. Appointment scheduled for Thursday, March 22, 2016 at 1120. Let patient know will follow up with her within the next couple weeks with results of Pap smear by phone.Discussed smoking cessation with patient. Referred patient to the [facility] Quitline and gave resources to free smoking cessation classes at [facility]. Melanie Owens verbalized understanding.  Melanie Owens, Arvil Chaco, RN 10:33 AM

## 2016-03-22 NOTE — Addendum Note (Signed)
Encounter addended by: Loletta Parish, RN on: 03/22/2016 11:15 AM<BR>    Actions taken: Sign clinical note

## 2016-03-22 NOTE — Progress Notes (Signed)
Complaints of right breast itching and changes in the color of the skin with possible lump x 2 weeks.  Pap Smear: Pap smear completed today. Last Pap smear was 10/28/2012 at the [facility] and normal. Per patient has no history of an abnormal Pap smear. Last Pap smear result is in EPIC.  Physical exam: Breasts Breasts symmetrical. Skin appeared to be slightly darker in color right breast compared to left breast. Per patient her right breast has never been that dark and is a change for her. No redness, scaly, or Peau d'orange appearance of skin observed on bilateral breast. No nipple retraction bilateral breasts. No nipple discharge bilateral breasts. No lymphadenopathy. No lumps palpated bilateral breasts. No complaints of pain or tenderness on exam. Referred patient to the [place] for a diagnostic mammogram and possible right breast ultrasound. Appointment scheduled for Thursday, March 22, 2016 at 1120.  Pelvic/Bimanual   Ext Genitalia No lesions, no swelling and no discharge observed on external genitalia.         Vagina Vagina pink and normal texture. No lesions or discharge observed in vagina.          Cervix Cervix is present. Cervix pink and of normal texture. No discharge observed.     Uterus Uterus is present and palpable. Uterus in normal position and normal size.        Adnexae Bilateral ovaries present and palpable. No tenderness on palpation.          Rectovaginal No rectal exam completed today since patient had no rectal complaints. No skin abnormalities observed on exam.    Smoking History: Patient is a current smoker. Discussed smoking cessation with patient. Referred patient to the [facility] Quitline and gave resources to free smoking cessation classes at [facility].  Patient Navigation: Patient education provided. Access to services provided for patient through [facility] program.

## 2016-03-26 ENCOUNTER — Encounter (HOSPITAL_COMMUNITY): Payer: Self-pay | Admitting: *Deleted

## 2016-03-28 LAB — CYTOLOGY - PAP
Diagnosis: NEGATIVE
HPV (WINDOPATH): DETECTED — AB

## 2016-05-09 ENCOUNTER — Encounter (HOSPITAL_COMMUNITY): Payer: Self-pay | Admitting: Obstetrics and Gynecology

## 2016-05-09 ENCOUNTER — Encounter (HOSPITAL_COMMUNITY): Payer: Self-pay

## 2016-05-18 ENCOUNTER — Encounter (HOSPITAL_COMMUNITY): Payer: Self-pay | Admitting: *Deleted

## 2016-05-18 NOTE — Progress Notes (Signed)
Letter mailed to patient concerning pap smear results.  

## 2016-06-27 ENCOUNTER — Encounter: Payer: Self-pay | Admitting: Gynecology

## 2016-08-16 IMAGING — CR DG CHEST 2V
2 series · 2 of 2 positions shown · non-contrast
Comparison: 12/21/2002.

CLINICAL DATA: 44-year-old female with severe back pain, fever,
chills, nausea, vomiting, cough. Initial encounter. Smoker.

EXAM:
CHEST  2 VIEW

[chest pa]
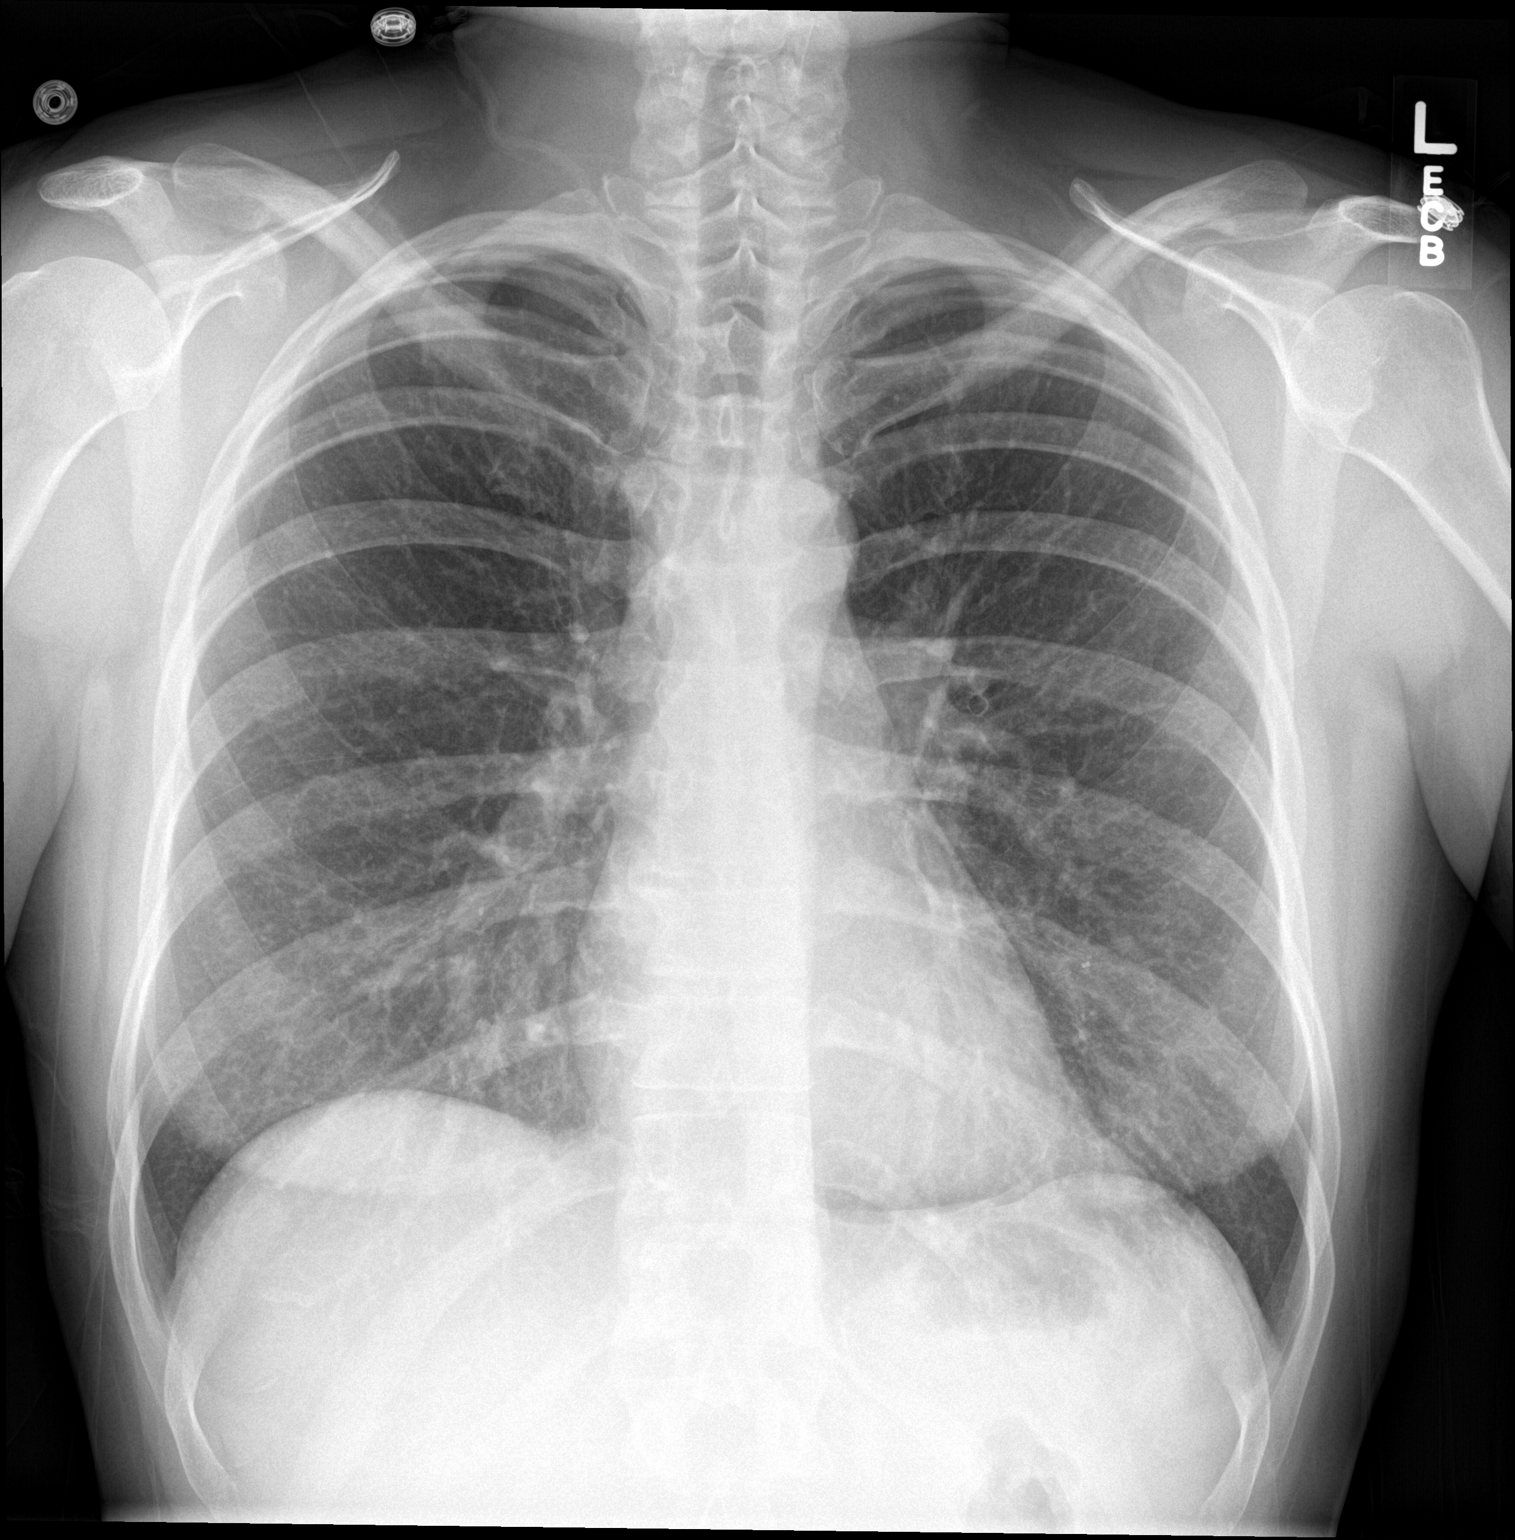

[chest lat]
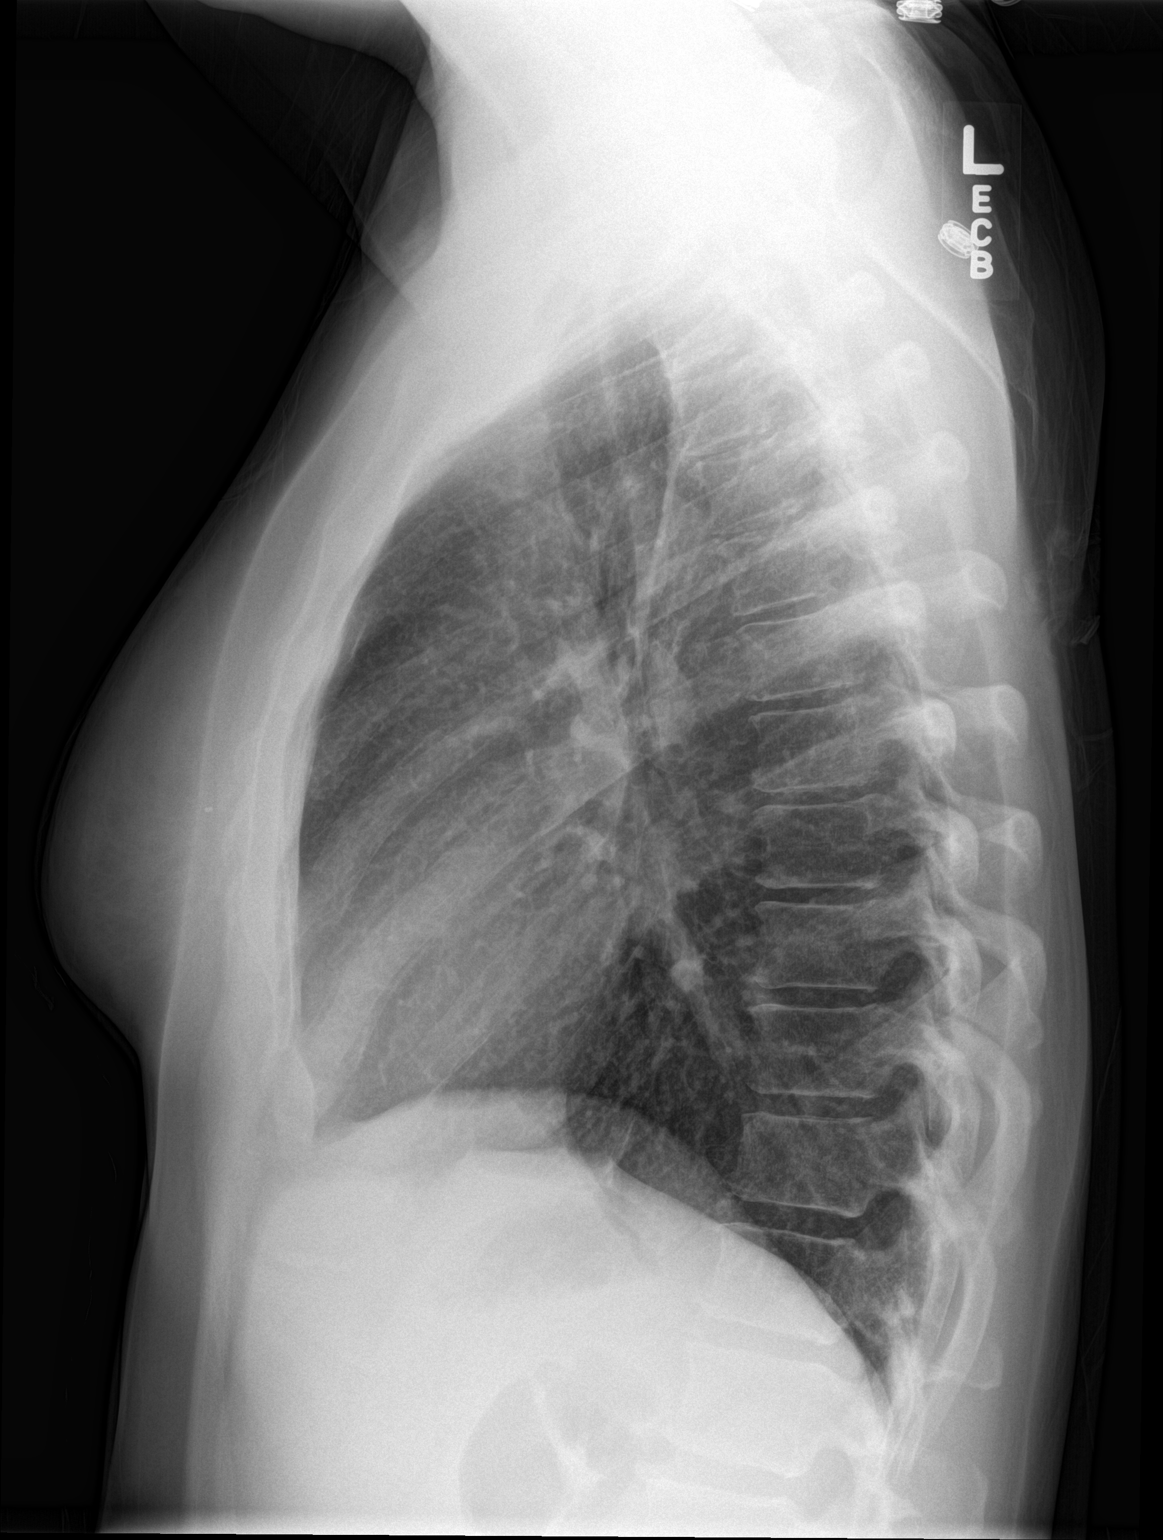

[2 of 2 positions shown; findings below may reference images not displayed]

FINDINGS: Lung volumes remain within normal limits. Normal cardiac size and
mediastinal contours. Visualized tracheal air column is within
normal limits. No pneumothorax, pulmonary edema, pleural effusion or
confluent pulmonary opacity. Increased interstitial markings in both
lungs since 9338. No acute osseous abnormality identified. Negative
visible bowel gas pattern.
IMPRESSION: Increased pulmonary interstitial markings might be chronic due to
smoking, but consider viral/ atypical respiratory infection in this
setting. No pleural effusion.

## 2017-02-25 ENCOUNTER — Other Ambulatory Visit: Payer: Self-pay | Admitting: Obstetrics and Gynecology

## 2017-02-25 DIAGNOSIS — Z1231 Encounter for screening mammogram for malignant neoplasm of breast: Secondary | ICD-10-CM

## 2017-03-07 ENCOUNTER — Other Ambulatory Visit: Payer: Self-pay | Admitting: Obstetrics and Gynecology

## 2017-03-07 DIAGNOSIS — Z1231 Encounter for screening mammogram for malignant neoplasm of breast: Secondary | ICD-10-CM

## 2017-04-11 ENCOUNTER — Ambulatory Visit (HOSPITAL_COMMUNITY)
Admission: RE | Admit: 2017-04-11 | Discharge: 2017-04-11 | Disposition: A | Discharge status: Home / Self Care | Payer: Self-pay | Source: Ambulatory Visit | Attending: Obstetrics and Gynecology | Admitting: Obstetrics and Gynecology

## 2017-04-11 ENCOUNTER — Encounter (HOSPITAL_COMMUNITY): Payer: Self-pay

## 2017-04-11 ENCOUNTER — Ambulatory Visit
Admission: RE | Admit: 2017-04-11 | Discharge: 2017-04-11 | Disposition: A | Discharge status: Home / Self Care | Payer: No Typology Code available for payment source | Source: Ambulatory Visit | Attending: Obstetrics and Gynecology | Admitting: Obstetrics and Gynecology

## 2017-04-11 VITALS — BP 122/68 | Ht 64.5 in | Wt 137.4 lb

## 2017-04-11 DIAGNOSIS — Z01419 Encounter for gynecological examination (general) (routine) without abnormal findings: Secondary | ICD-10-CM

## 2017-04-11 DIAGNOSIS — Z1231 Encounter for screening mammogram for malignant neoplasm of breast: Secondary | ICD-10-CM

## 2017-04-11 NOTE — Progress Notes (Signed)
No complaints today.   Pap Smear: Pap smear completed today. Last Pap smear was 03/22/2016 at [facility] and normal with positive HPV. Per patient has no history of an abnormal Pap smear. Last two Pap smear results are in Epic.  Physical exam: Breasts Breasts symmetrical. No skin abnormalities bilateral breasts. No nipple retraction bilateral breasts. No nipple discharge bilateral breasts. No lymphadenopathy. No lumps palpated bilateral breasts. No complaints of pain or tenderness on exam. Referred patient to the [place] for a screening mammogram. Appointment scheduled for Thursday, April 11, 2017 at 1440.        Pelvic/Bimanual   Ext Genitalia No lesions, no swelling and no discharge observed on external genitalia.         Vagina Vagina pink and normal texture. No lesions or discharge observed in vagina.          Cervix Cervix is present. Cervix pink and of normal texture. No discharge observed.     Uterus Uterus is present and palpable. Uterus in normal position and normal size.        Adnexae Bilateral ovaries present and palpable. No tenderness on palpation.         Rectovaginal No rectal exam completed today since patient had no rectal complaints. No skin abnormalities observed on exam.    Smoking History: Patient is a current smoker. Discussed smoking cessation with patient. Referred patient to the [facility] Quitline and gave resources to free smoking cessation classes at [facility].  Patient Navigation: Patient education provided. Access to services provided for patient through [facility] program. Spanish interpreter provided.   Breast and Cervical Cancer Risk Assessment: Patient has a family history of mother and paternal grandmother having breast cancer. Patient has no history of known genetic mutations or radiation treatment to the chest before age 65. Patient has no history of cervical dysplasia, immunocompromised, or DES exposure in-utero.

## 2017-04-11 NOTE — Patient Instructions (Signed)
Explained breast self awareness with Kittie Plater. Let patient know that if today's Pap smear is normal that her next Pap smear will be due in one year due to her history of her last Pap smear being HPV positive. Referred patient to the [place] for a screening mammogram. Appointment scheduled for Thursday, April 11, 2017 at 1440. Let patient know will follow up with her within the next couple weeks with results of Pap smear by phone. Informed patient that the [facility] Center will follow-up with her within the next couple of weeks with results of mammogram by letter or phone. Charmayne Sheer Kerney verbalized understanding.  Shams Fill, Arvil Chaco, RN 2:59 PM

## 2017-04-12 ENCOUNTER — Encounter (HOSPITAL_COMMUNITY): Payer: Self-pay | Admitting: *Deleted

## 2017-04-15 LAB — CYTOLOGY - PAP
Diagnosis: NEGATIVE
HPV (WINDOPATH): DETECTED — AB

## 2017-05-01 ENCOUNTER — Telehealth (HOSPITAL_COMMUNITY): Payer: Self-pay | Admitting: *Deleted

## 2017-05-01 NOTE — Telephone Encounter (Signed)
Patient called and left voicemail with questions in regards to her Pap smear being positive for HPV. Called patient back. Explained the HPV virus with patient. Patient asked if shows the specific type of HPV present. Explained to patient the Pap smear only shows positive HPV and doesn't show specific strain. Told patient that her Pap smear is normal and the HPV positive. Explained to patient that her next Pap smear is due in one year and no additional follow-up is recommended. Patient verbalized understanding.

## 2018-02-10 IMAGING — MG 2D DIGITAL DIAGNOSTIC BILATERAL MAMMOGRAM WITH CAD AND ADJUNCT T
8 of 12 series · 8 of 28 positions shown · non-contrast
Comparison: Previous exam(s).

CLINICAL DATA: Right inferior breast itching an skin scaling, first
noted by the patient 2 weeks ago.

EXAM:
2D DIGITAL DIAGNOSTIC BILATERAL MAMMOGRAM WITH CAD AND ADJUNCT TOMO
ULTRASOUND RIGHT BREAST

[L MLO synth-2D]
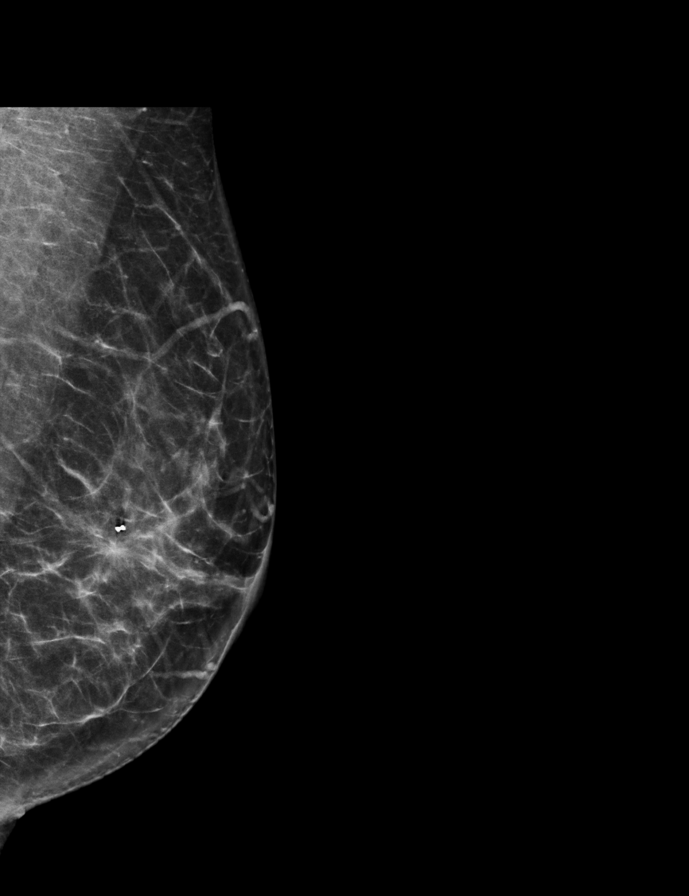

[R CC synth-2D]
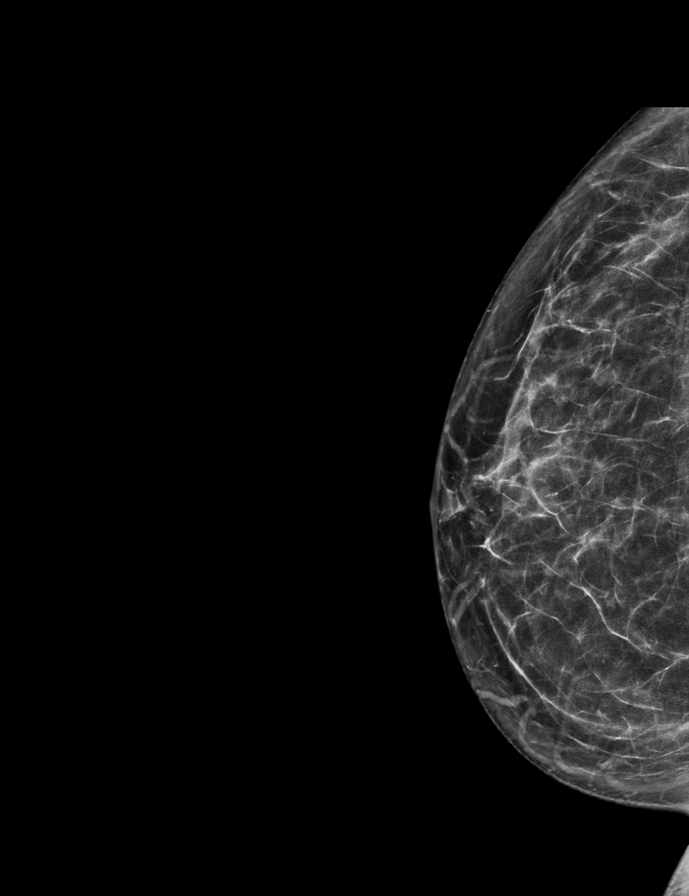

[L MLO]
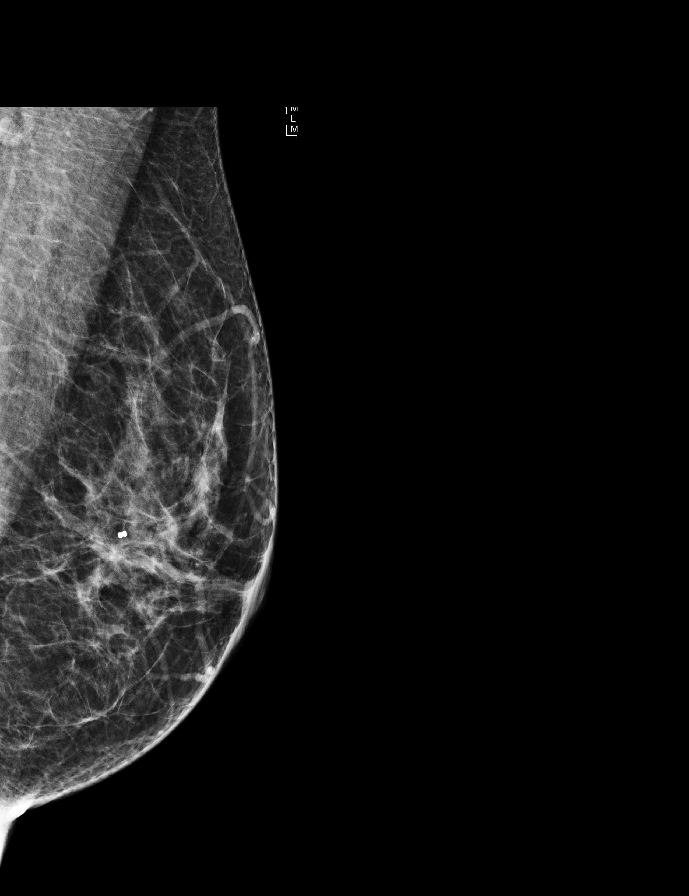

[R CC]
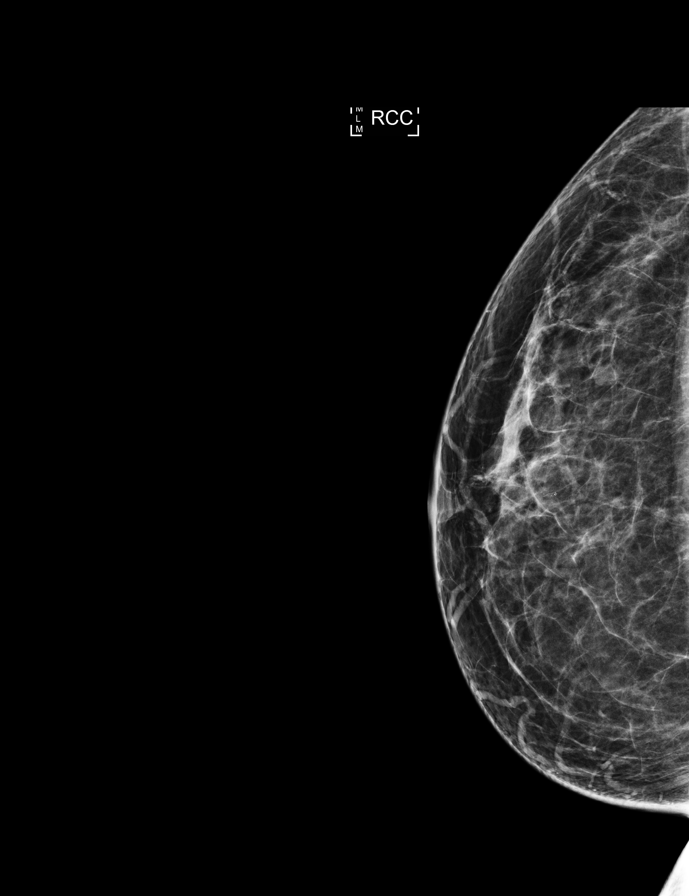

[L CC]
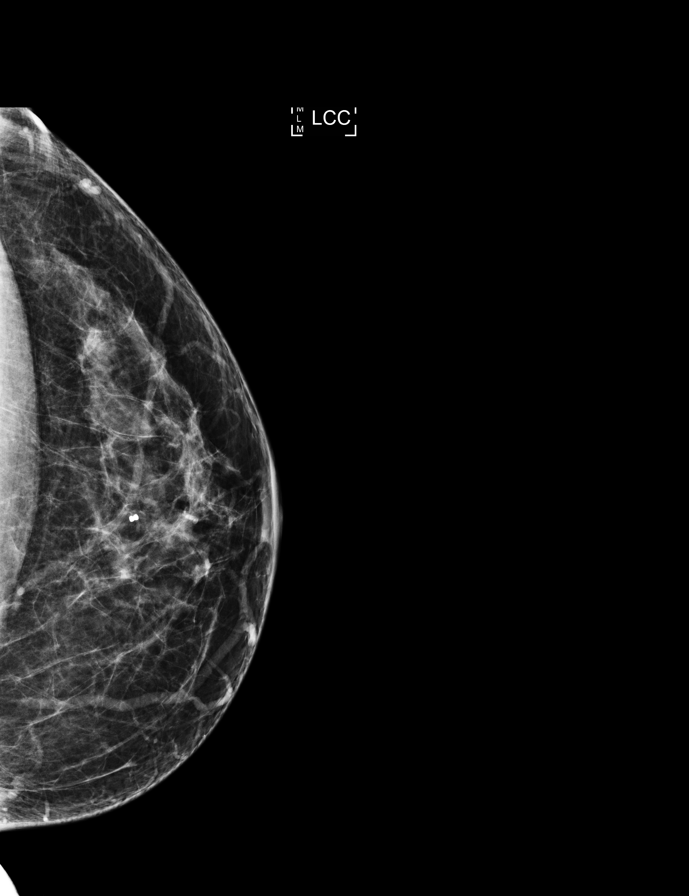

[L CC synth-2D]
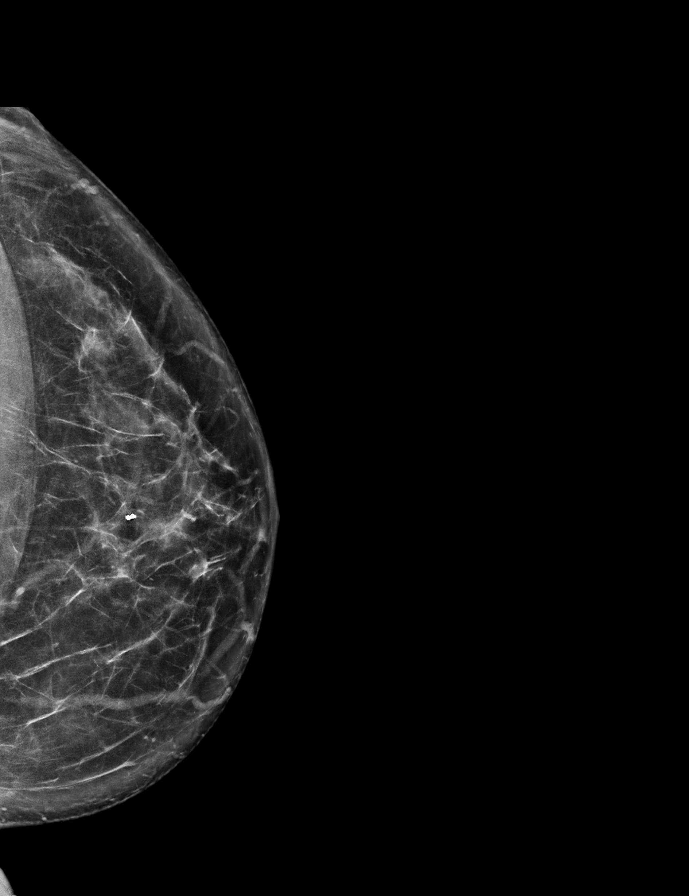

[R MLO synth-2D]
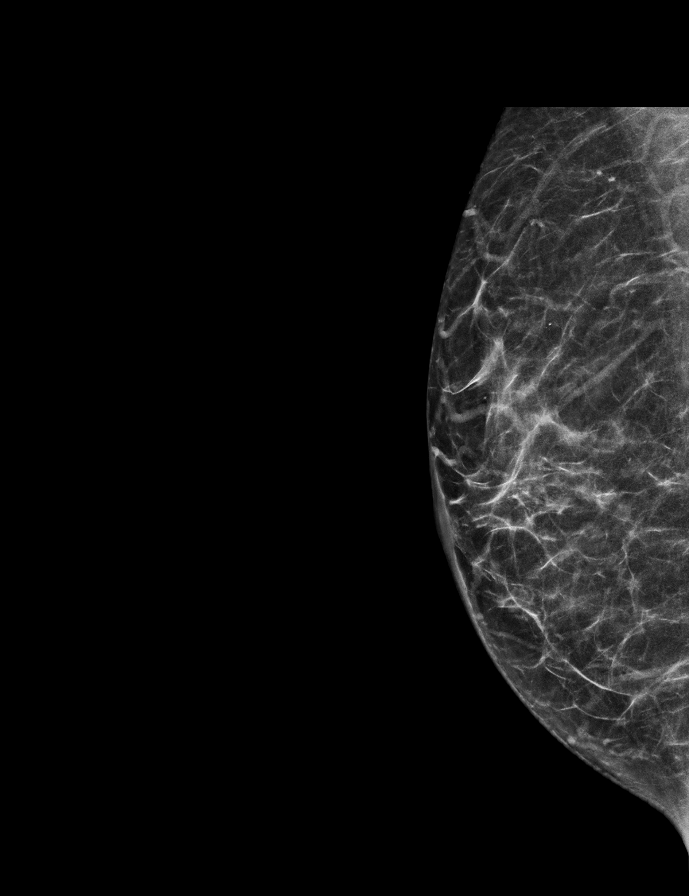

[R MLO]
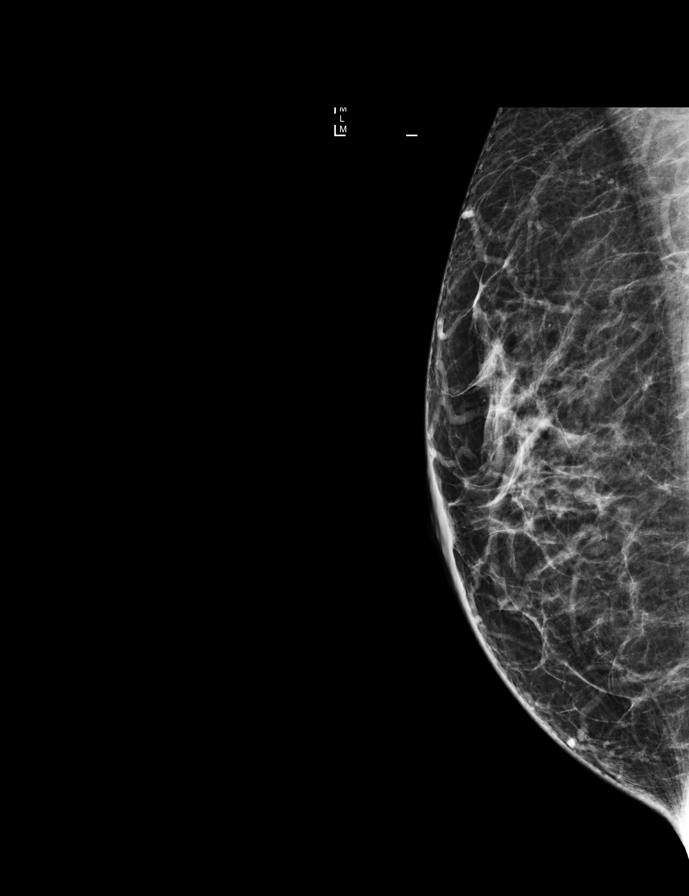

[8 of 28 positions shown; findings below may reference images not displayed]

ACR Breast Density Category c: The breast tissue is heterogeneously
dense, which may obscure small masses.
FINDINGS: Mammographically, there are no suspicious masses, areas of
architectural distortion or microcalcifications in either breast.
Stable post biopsy changes in the left breast. Stable
benign-appearing subcentimeter nodule in the lateral right breast.

Mammographic images were processed with CAD.

On physical exam, no suspicious masses are palpated. Slight darker
discoloration is seen in the lower inner quadrant of the right
areolar border.

Targeted ultrasound is performed, showing no suspicious masses or
shadowing lesions.
IMPRESSION: No mammographic sonographic evidence of malignancy in either breast.

RECOMMENDATION:
Screening mammogram in one year.(Code:[id number]-48I)

Further management of patient's right areolar skin discoloration and
itching should be based on clinical grounds.

I have discussed the findings and recommendations with the patient.
Results were also provided in writing at the conclusion of the
visit. If applicable, a reminder letter will be sent to the patient
regarding the next appointment.

BI-RADS CATEGORY  2: Benign.

## 2018-03-11 ENCOUNTER — Telehealth (HOSPITAL_COMMUNITY): Payer: Self-pay

## 2018-03-11 ENCOUNTER — Other Ambulatory Visit (HOSPITAL_COMMUNITY): Payer: Self-pay | Admitting: *Deleted

## 2018-03-11 DIAGNOSIS — Z1231 Encounter for screening mammogram for malignant neoplasm of breast: Secondary | ICD-10-CM

## 2018-03-11 NOTE — Telephone Encounter (Signed)
Left message with patient letting her know that I was returning her phone call. I left my number for her to call back.

## 2018-05-13 ENCOUNTER — Other Ambulatory Visit (HOSPITAL_COMMUNITY): Payer: Self-pay | Admitting: *Deleted

## 2018-05-13 DIAGNOSIS — Z1231 Encounter for screening mammogram for malignant neoplasm of breast: Secondary | ICD-10-CM

## 2018-06-12 ENCOUNTER — Ambulatory Visit (HOSPITAL_COMMUNITY): Payer: No Typology Code available for payment source

## 2018-09-30 ENCOUNTER — Ambulatory Visit (HOSPITAL_COMMUNITY): Payer: No Typology Code available for payment source

## 2018-12-04 ENCOUNTER — Ambulatory Visit (HOSPITAL_COMMUNITY)
Admission: RE | Admit: 2018-12-04 | Discharge: 2018-12-04 | Disposition: A | Discharge status: Home / Self Care | Payer: No Typology Code available for payment source | Source: Ambulatory Visit | Attending: Obstetrics and Gynecology | Admitting: Obstetrics and Gynecology

## 2018-12-04 ENCOUNTER — Other Ambulatory Visit: Payer: Self-pay

## 2018-12-04 ENCOUNTER — Encounter (HOSPITAL_COMMUNITY): Payer: Self-pay

## 2018-12-04 ENCOUNTER — Ambulatory Visit
Admission: RE | Admit: 2018-12-04 | Discharge: 2018-12-04 | Disposition: A | Discharge status: Home / Self Care | Payer: No Typology Code available for payment source | Source: Ambulatory Visit | Attending: Obstetrics and Gynecology | Admitting: Obstetrics and Gynecology

## 2018-12-04 DIAGNOSIS — Z01419 Encounter for gynecological examination (general) (routine) without abnormal findings: Secondary | ICD-10-CM

## 2018-12-04 DIAGNOSIS — R87612 Low grade squamous intraepithelial lesion on cytologic smear of cervix (LGSIL): Secondary | ICD-10-CM | POA: Insufficient documentation

## 2018-12-04 DIAGNOSIS — Z1231 Encounter for screening mammogram for malignant neoplasm of breast: Secondary | ICD-10-CM

## 2018-12-04 NOTE — Patient Instructions (Signed)
Explained breast self awareness with Kittie Plater. Let patient know that her next Pap smeare will be due based on the results of today's Pap smear. Referred patient to the [place] for a screening mammogram. Appointment scheduled for Thursday, December 04, 2018 at 1240. Patient aware of appointment and will be there. Let patient know will follow up with her within the next couple weeks with results of Pap smear by letter or phone. Informed patient that the [facility] Center will follow-up with her within the next couple of weeks with results of her mammogram by letter or phone. Discussed smoking cessation with patient. Referred patient to the [facility] Quitline and gave resources to free smoking cessation classes at [facility]. Melanie Owens verbalized understanding.  Melanie Owens, Arvil Chaco, RN 10:57 AM

## 2018-12-04 NOTE — Progress Notes (Signed)
No complaints today.   Pap Smear: Pap smear completed today. Last Pap smear was 04/11/2017 at [facility] and normal with positive HPV. Patients previous Pap smear 03/22/2016 was normal with positive HPV. Per patient has no history of an abnormal Pap smear. Last two Pap smear results are in Epic.   Physical exam: Breasts Breasts symmetrical. No skin abnormalities bilateral breasts. No nipple retraction bilateral breasts. No nipple discharge bilateral breasts. No lymphadenopathy. No lumps palpated bilateral breasts. No complaints of pain or tenderness on exam. Referred patient to the [place] for a screening mammogram. Appointment scheduled for Thursday, December 04, 2018 at 1240.      Pelvic/Bimanual   Ext Genitalia No lesions, no swelling and no discharge observed on external genitalia.         Vagina Vagina pink and normal texture. No lesions or discharge observed in vagina.          Cervix Cervix is present. Cervix pink and of normal texture. No discharge observed.     Uterus Uterus is present and palpable. Uterus in normal position and normal size.       Adnexae Bilateral ovaries present and palpable. No tenderness on palpation.         Rectovaginal No rectal exam completed today since patient had no rectal complaints. No skin abnormalities observed on exam.    Smoking History: Patientis a current smoker. Discussed smoking cessation with patient. Referred patient to the [facility] Quitline and gave resources to free smoking cessation classes at [facility].  Patient Navigation: Patient education provided. Access to services provided for patient through [facility] program.   Breast and Cervical Cancer Risk Assessment: Patient has a family history of mother and paternal grandmother having breast cancer. Patient has no history of known genetic mutations or radiation treatment to the chest before age 5. Patient has no history of cervical dysplasia, immunocompromised, or DES  exposure in-utero.  Risk Assessment    Risk Scores      12/04/2018   Last edited by: Loletta Parish, RN   5-year risk: 2.4 %   Lifetime risk: 20.6 %

## 2018-12-09 LAB — CYTOLOGY - PAP
Comment: NEGATIVE
High risk HPV: NEGATIVE

## 2018-12-15 ENCOUNTER — Telehealth (HOSPITAL_COMMUNITY): Payer: Self-pay | Admitting: *Deleted

## 2018-12-15 NOTE — Telephone Encounter (Signed)
Patient returned my phone call. Explained to patient her Pap smear result and let her know it showed some low grade abnormal cells and the HPV was negative. Explained to patient the colposcopy the recommended follow-up for her abnormal Pap smear. Gave patient appointment to the [facility] for [place] on 01/21/2019 at 1355. Let patient know the colposcopy is covered by [facility]. Answered all of patients questions and patient advised to call me if thinks of any additional questions. Patient verbalized understanding.

## 2018-12-15 NOTE — Telephone Encounter (Signed)
Attempted to call patient to discuss Pap smear result and give follow-up appointment at the [facility] for [facility]. No one answered the phone. Left voicemail for patient to call me back.

## 2019-01-21 ENCOUNTER — Ambulatory Visit (INDEPENDENT_AMBULATORY_CARE_PROVIDER_SITE_OTHER): Payer: Self-pay | Admitting: Obstetrics & Gynecology

## 2019-01-21 ENCOUNTER — Other Ambulatory Visit: Payer: Self-pay

## 2019-01-21 ENCOUNTER — Encounter: Payer: Self-pay | Admitting: Obstetrics & Gynecology

## 2019-01-21 ENCOUNTER — Other Ambulatory Visit (HOSPITAL_COMMUNITY)
Admission: RE | Admit: 2019-01-21 | Discharge: 2019-01-21 | Disposition: A | Discharge status: Home / Self Care | Payer: No Typology Code available for payment source | Source: Ambulatory Visit | Attending: Obstetrics & Gynecology | Admitting: Obstetrics & Gynecology

## 2019-01-21 VITALS — BP 123/73 | HR 91 | Wt 136.4 lb

## 2019-01-21 DIAGNOSIS — R87612 Low grade squamous intraepithelial lesion on cytologic smear of cervix (LGSIL): Secondary | ICD-10-CM

## 2019-01-21 LAB — POCT PREGNANCY, URINE
Preg Test, Ur: NEGATIVE
Preg Test, Ur: NEGATIVE

## 2019-01-21 NOTE — Patient Instructions (Signed)
COLPOSCOPY POST-PROCEDURE INSTRUCTIONS  1. You may take Ibuprofen, Aleve or Tylenol for cramping if needed.  2. If Monsel's solution was used, you will have a black discharge.  3. Light bleeding is normal.  If bleeding is heavier than your period, please call.  4. Put nothing in your vagina until the bleeding or discharge stops (usually 2 or 3 days).  5. We will call you within one week with biopsy results or discuss the results at your follow-up appointment if needed.  

## 2019-01-21 NOTE — Progress Notes (Signed)
    GYNECOLOGY CLINIC COLPOSCOPY PROCEDURE NOTE  48 y.o. [id number] here for colposcopy for low-grade squamous intraepithelial neoplasia (LGSIL - encompassing HPV,mild dysplasia,CIN I) pap smear on 12/04/2018. Discussed role for HPV in cervical dysplasia, need for surveillance.  Patient given informed consent, signed copy in the chart, time out was performed.  Placed in lithotomy position. Cervix viewed with speculum and colposcope after application of acetic acid.   Colposcopy adequate? Yes No abnormalities noted.  ECC specimen obtained; this was labeled and sent to pathology.  Patient was given post procedure instructions.  Will follow up pathology and manage accordingly; patient will be contacted with results and recommendations.  Routine preventative health maintenance measures emphasized.    Verita Schneiders, MD, [place] for [employer], [place]

## 2019-01-23 LAB — SURGICAL PATHOLOGY

## 2019-01-26 ENCOUNTER — Encounter: Payer: Self-pay | Admitting: Obstetrics & Gynecology

## 2019-01-26 ENCOUNTER — Telehealth: Payer: Self-pay

## 2019-01-26 NOTE — Progress Notes (Signed)
Unfortunately, despite aggressive ECC during colposcopy procedure, inadequate material was obtained and insufficient for diagnosis.  Repeat colposcopy recommended.  Please call to inform patient of results and recommendations.

## 2019-01-26 NOTE — Telephone Encounter (Addendum)
-----   Message from Osborne Oman, MD sent at 01/26/2019 12:37 PM EST ----- Unfortunately, despite aggressive ECC during colposcopy procedure, inadequate material was obtained and insufficient for diagnosis.  Repeat colposcopy recommended.  Please call to inform patient of results and recommendations.  Notified pt provider's recommendation and the need to schedule a rpt colposcopy.  Pt was not happy however stated that she is willing to reschedule.  [place] office notified to call pt to schedule colposcopy.

## 2019-02-11 ENCOUNTER — Telehealth: Payer: Self-pay | Admitting: General Practice

## 2019-02-11 NOTE — Telephone Encounter (Signed)
Patient called and left message on nurse voicemail line stating she is calling to see if they still need to do another colposcopy and if so when will the appt be.   Called patient and discussed need for repeat colposcopy still. Reviewed an appt has not been scheduled but I would reach out to the front office staff. Patient states she is confused why it needs to be done again and what is going to happen. Discussed with patient per Dr Arther Abbott note she will repeat the same test again. Discussed some cells did come back on the biopsy but not the ones we are needing to see for the test. Patient states she is very anxious and really does not want to do this again. States she just wants to get it over with asap and wants to know when someone will call her with an appt. Told patient I will send another message to our front office staff and hopefully she will hear from someone soon. Patient verbalized understanding.

## 2019-02-27 ENCOUNTER — Encounter: Payer: Self-pay | Admitting: Obstetrics & Gynecology

## 2019-02-27 ENCOUNTER — Other Ambulatory Visit (HOSPITAL_COMMUNITY)
Admission: RE | Admit: 2019-02-27 | Discharge: 2019-02-27 | Disposition: A | Discharge status: Home / Self Care | Payer: No Typology Code available for payment source | Source: Ambulatory Visit | Attending: Obstetrics & Gynecology | Admitting: Obstetrics & Gynecology

## 2019-02-27 ENCOUNTER — Other Ambulatory Visit: Payer: Self-pay

## 2019-02-27 ENCOUNTER — Ambulatory Visit (INDEPENDENT_AMBULATORY_CARE_PROVIDER_SITE_OTHER): Payer: Self-pay | Admitting: Obstetrics & Gynecology

## 2019-02-27 VITALS — BP 130/83 | HR 82 | Ht 64.0 in | Wt 137.8 lb

## 2019-02-27 DIAGNOSIS — R87615 Unsatisfactory cytologic smear of cervix: Secondary | ICD-10-CM | POA: Insufficient documentation

## 2019-02-27 DIAGNOSIS — N870 Mild cervical dysplasia: Secondary | ICD-10-CM

## 2019-02-27 DIAGNOSIS — R87612 Low grade squamous intraepithelial lesion on cytologic smear of cervix (LGSIL): Secondary | ICD-10-CM | POA: Insufficient documentation

## 2019-02-27 LAB — POCT PREGNANCY, URINE: Preg Test, Ur: NEGATIVE

## 2019-02-27 NOTE — Patient Instructions (Signed)

## 2019-02-27 NOTE — Progress Notes (Signed)
    GYNECOLOGY CLINIC COLPOSCOPY PROCEDURE NOTE  49 y.o. [id number] here for repeat colposcopy for low-grade squamous intraepithelial neoplasia (LGSIL - encompassing HPV,mild dysplasia,CIN I) pap smear on 12/04/2018. She had a colposcopy on 01/21/2019 that did not yield adequate cells according to pathology analysis. Discussed role for HPV in cervical dysplasia, need for surveillance.  Patient given informed consent, signed copy in the chart, time out was performed.  Placed in lithotomy position. Cervix viewed with speculum and colposcope after application of acetic acid.   Colposcopy adequate? Yes Mild acetowhite lesion noted at 1 o'clock; corresponding biopsy obtained.  Aggressive ECC specimen obtained. All specimens were labeled and sent to pathology.  Patient was given post procedure instructions.  Will follow up pathology and manage accordingly; patient will be contacted with results and recommendations.  Routine preventative health maintenance measures emphasized.   Verita Schneiders, MD, [place] for [employer], [place]

## 2019-03-02 LAB — SURGICAL PATHOLOGY

## 2019-03-03 ENCOUNTER — Encounter: Payer: Self-pay | Admitting: Obstetrics & Gynecology

## 2019-03-03 DIAGNOSIS — N870 Mild cervical dysplasia: Secondary | ICD-10-CM | POA: Insufficient documentation

## 2019-03-10 ENCOUNTER — Encounter: Payer: Self-pay | Admitting: General Practice

## 2019-04-10 ENCOUNTER — Encounter: Payer: No Typology Code available for payment source | Admitting: Gastroenterology

## 2019-08-10 ENCOUNTER — Encounter: Payer: Self-pay | Admitting: Gastroenterology

## 2019-09-08 ENCOUNTER — Ambulatory Visit (AMBULATORY_SURGERY_CENTER): Payer: Self-pay

## 2019-09-08 VITALS — Ht 64.0 in | Wt 140.0 lb

## 2019-09-08 DIAGNOSIS — Z86010 Personal history of colonic polyps: Secondary | ICD-10-CM

## 2019-09-08 DIAGNOSIS — Z01818 Encounter for other preprocedural examination: Secondary | ICD-10-CM

## 2019-09-08 MED ORDER — SUTAB 1479-225-188 MG PO TABS
12.0000 | ORAL_TABLET | ORAL | 0 refills | Status: DC
Start: 2019-09-08 — End: 2019-09-22

## 2019-09-08 NOTE — Progress Notes (Signed)
No egg or soy allergy known to patient  No issues with past sedation with any surgeries or procedures no intubation problems in the past  No diet pills per patient No home 02 use per patient  No blood thinners per patient  Pt denies issues with constipation  No A fib or A flutter  EMMI video to pt or MyChart  COVID 19 guidelines implemented in [place] today   VIRTUAL PRE-VISIT  PT HAS NO INSURANCE, INSTRUCTED PT TO CALL [facility] [facility]  RE: COVID TEST Pt to pick up SuTab sample the day of Covid test.    Due to the COVID-19 pandemic we are asking patients to follow these guidelines. Please only bring one care partner. Please be aware that your care partner may wait in the car in the parking lot or if they feel like they will be too hot to wait in the car, they may wait in the lobby on the 4th floor. All care partners are required to wear a mask the entire time (we do not have any that we can provide them), they need to practice social distancing, and we will do a Covid check for all patient's and care partners when you arrive. Also we will check their temperature and your temperature. If the care partner waits in their car they need to stay in the parking lot the entire time and we will call them on their cell phone when the patient is ready for discharge so they can bring the car to the front of the building. Also all patient's will need to wear a mask into building.

## 2019-09-15 ENCOUNTER — Ambulatory Visit (INDEPENDENT_AMBULATORY_CARE_PROVIDER_SITE_OTHER): Payer: Self-pay

## 2019-09-15 ENCOUNTER — Other Ambulatory Visit: Payer: Self-pay | Admitting: Gastroenterology

## 2019-09-15 DIAGNOSIS — Z1159 Encounter for screening for other viral diseases: Secondary | ICD-10-CM

## 2019-09-15 LAB — SARS CORONAVIRUS 2 (TAT 6-24 HRS): SARS Coronavirus 2: NEGATIVE

## 2019-09-21 ENCOUNTER — Encounter: Payer: No Typology Code available for payment source | Admitting: Gastroenterology

## 2019-09-22 ENCOUNTER — Ambulatory Visit (AMBULATORY_SURGERY_CENTER): Payer: Self-pay | Admitting: Gastroenterology

## 2019-09-22 ENCOUNTER — Other Ambulatory Visit: Payer: Self-pay

## 2019-09-22 ENCOUNTER — Encounter: Payer: Self-pay | Admitting: Gastroenterology

## 2019-09-22 VITALS — BP 114/63 | HR 67 | Temp 97.8°F | Resp 12 | Ht 64.0 in | Wt 140.0 lb

## 2019-09-22 DIAGNOSIS — D122 Benign neoplasm of ascending colon: Secondary | ICD-10-CM

## 2019-09-22 DIAGNOSIS — D124 Benign neoplasm of descending colon: Secondary | ICD-10-CM

## 2019-09-22 DIAGNOSIS — D123 Benign neoplasm of transverse colon: Secondary | ICD-10-CM

## 2019-09-22 DIAGNOSIS — Z86010 Personal history of colonic polyps: Secondary | ICD-10-CM

## 2019-09-22 MED ORDER — SODIUM CHLORIDE 0.9 % IV SOLN: 500.0000 mL | Freq: Once | INTRAVENOUS | Status: DC

## 2019-09-22 NOTE — Progress Notes (Signed)
To PACU, VSS. Report to RN.tb 

## 2019-09-22 NOTE — Op Note (Signed)
[facility] Patient Name: Melanie Owens Procedure Date: 09/22/2019 11:06 AM MRN: [id number] Endoscopist: Mauri Pole , MD Age: 49 Referring MD:  Date of Birth: 03-Jul-1970 Gender: Female Account #: [id number] Procedure:                Colonoscopy Indications:              High risk colon cancer surveillance: Personal                            history of adenoma (10 mm or greater in size) Medicines:                Monitored Anesthesia Care Procedure:                Pre-Anesthesia Assessment:                           - Prior to the procedure, a History and Physical                            was performed, and patient medications and                            allergies were reviewed. The patient's tolerance of                            previous anesthesia was also reviewed. The risks                            and benefits of the procedure and the sedation                            options and risks were discussed with the patient.                            All questions were answered, and informed consent                            was obtained. Prior Anticoagulants: The patient has                            taken no previous anticoagulant or antiplatelet                            agents. ASA Grade Assessment: II - A patient with                            mild systemic disease. After reviewing the risks                            and benefits, the patient was deemed in                            satisfactory condition to undergo the procedure.  After obtaining informed consent, the colonoscope                            was passed under direct vision. Throughout the                            procedure, the patient's blood pressure, pulse, and                            oxygen saturations were monitored continuously. The                            Colonoscope was introduced through the anus and                            advanced to the  the cecum, identified by                            appendiceal orifice and ileocecal valve. The                            colonoscopy was performed without difficulty. The                            patient tolerated the procedure well. The quality                            of the bowel preparation was good. The ileocecal                            valve, appendiceal orifice, and rectum were                            photographed. Scope In: 11:11:47 AM Scope Out: 11:26:56 AM Scope Withdrawal Time: 0 hours 11 minutes 46 seconds  Total Procedure Duration: 0 hours 15 minutes 9 seconds  Findings:                 The perianal and digital rectal examinations were                            normal.                           Two sessile polyps were found in the transverse                            colon and ascending colon. The polyps were 4 to 5                            mm in size. These polyps were removed with a cold                            snare. Resection and retrieval were complete.  Two sessile polyps were found in the descending                            colon and transverse colon. The polyps were 1 to 2                            mm in size. These polyps were removed with a cold                            biopsy forceps. Resection and retrieval were                            complete.                           Non-bleeding internal hemorrhoids were found during                            retroflexion. The hemorrhoids were small. Complications:            No immediate complications. Estimated Blood Loss:     Estimated blood loss was minimal. Impression:               - Two 4 to 5 mm polyps in the transverse colon and                            in the ascending colon, removed with a cold snare.                            Resected and retrieved.                           - Two 1 to 2 mm polyps in the descending colon and                            in  the transverse colon, removed with a cold biopsy                            forceps. Resected and retrieved.                           - Non-bleeding internal hemorrhoids. Recommendation:           - Patient has a contact number available for                            emergencies. The signs and symptoms of potential                            delayed complications were discussed with the                            patient. Return to normal activities tomorrow.  Written discharge instructions were provided to the                            patient.                           - Resume previous diet.                           - Continue present medications.                           - Await pathology results.                           - Repeat colonoscopy in 3 - 5 years for                            surveillance based on pathology results. Mauri Pole, MD 09/22/2019 11:32:43 AM This report has been signed electronically.

## 2019-09-22 NOTE — Progress Notes (Signed)
Vitals-CW  Pt's states no medical or surgical changes since previsit or office visit.  Very difficult stick. Patient very nervous.

## 2019-09-22 NOTE — Progress Notes (Signed)
Called to room to assist during endoscopic procedure.  Patient ID and intended procedure confirmed with present staff. Received instructions for my participation in the procedure from the performing physician.  

## 2019-09-22 NOTE — Patient Instructions (Signed)
Handout on polyps given to you today  Await pathology results   YOU HAD AN ENDOSCOPIC PROCEDURE TODAY AT THE [facility]:   Refer to the procedure report that was given to you for any specific questions about what was found during the examination.  If the procedure report does not answer your questions, please call your gastroenterologist to clarify.  If you requested that your care partner not be given the details of your procedure findings, then the procedure report has been included in a sealed envelope for you to review at your convenience later.  YOU SHOULD EXPECT: Some feelings of bloating in the abdomen. Passage of more gas than usual.  Walking can help get rid of the air that was put into your GI tract during the procedure and reduce the bloating. If you had a lower endoscopy (such as a colonoscopy or flexible sigmoidoscopy) you may notice spotting of blood in your stool or on the toilet paper. If you underwent a bowel prep for your procedure, you may not have a normal bowel movement for a few days.  Please Note:  You might notice some irritation and congestion in your nose or some drainage.  This is from the oxygen used during your procedure.  There is no need for concern and it should clear up in a day or so.  SYMPTOMS TO REPORT IMMEDIATELY:   Following lower endoscopy (colonoscopy or flexible sigmoidoscopy):  Excessive amounts of blood in the stool  Significant tenderness or worsening of abdominal pains  Swelling of the abdomen that is new, acute  Fever of 100F or higher  For urgent or emergent issues, a gastroenterologist can be reached at any hour by calling [phone]. Do not use MyChart messaging for urgent concerns.    DIET:  We do recommend a small meal at first, but then you may proceed to your regular diet.  Drink plenty of fluids but you should avoid alcoholic beverages for 24 hours.  ACTIVITY:  You should plan to take it easy for the rest of today and  you should NOT DRIVE or use heavy machinery until tomorrow (because of the sedation medicines used during the test).    FOLLOW UP: Our staff will call the number listed on your records 48-72 hours following your procedure to check on you and address any questions or concerns that you may have regarding the information given to you following your procedure. If we do not reach you, we will leave a message.  We will attempt to reach you two times.  During this call, we will ask if you have developed any symptoms of COVID 19. If you develop any symptoms (ie: fever, flu-like symptoms, shortness of breath, cough etc.) before then, please call [phone].  If you test positive for Covid 19 in the 2 weeks post procedure, please call and report this information to us.    If any biopsies were taken you will be contacted by phone or by letter within the next 1-3 weeks.  Please call us at [phone] if you have not heard about the biopsies in 3 weeks.    SIGNATURES/CONFIDENTIALITY: You and/or your care partner have signed paperwork which will be entered into your electronic medical record.  These signatures attest to the fact that that the information above on your After Visit Summary has been reviewed and is understood.  Full responsibility of the confidentiality of this discharge information lies with you and/or your care-partner. 

## 2019-09-24 ENCOUNTER — Telehealth: Payer: Self-pay

## 2019-09-24 NOTE — Telephone Encounter (Signed)
  Follow up Call-  Call back number 09/22/2019  Post procedure Call Back phone  # [phone]  Permission to leave phone message Yes  Some recent data might be hidden     Patient questions:  Do you have a fever, pain , or abdominal swelling? No. Pain Score  0 *  Have you tolerated food without any problems? Yes.    Have you been able to return to your normal activities? Yes.    Do you have any questions about your discharge instructions: Diet   No. Medications  No. Follow up visit  No.  Do you have questions or concerns about your Care? No.  Actions: * If pain score is 4 or above: 1. No action needed, pain <4.Have you developed a fever since your procedure? no  2.   Have you had an respiratory symptoms (SOB or cough) since your procedure? no  3.   Have you tested positive for COVID 19 since your procedure no  4.   Have you had any family members/close contacts diagnosed with the COVID 19 since your procedure?  no   If yes to any of these questions please route to Joylene John, RN and Joella Prince, RN

## 2019-10-02 ENCOUNTER — Encounter: Payer: Self-pay | Admitting: Gastroenterology

## 2019-10-20 ENCOUNTER — Telehealth: Payer: Self-pay | Admitting: Gastroenterology

## 2019-10-20 NOTE — Telephone Encounter (Signed)
The pt had a procedure on 8/10 and the nurses had a had time getting an IV and she was stuck a couple times.  She was bruised but that has resolved.  She does state that the area is now swollen and sore.  She was advised to call her PCP and get the arm evaluated.  She has been applying heat without relief.  Dr Darleen Crocker

## 2019-10-20 NOTE — Telephone Encounter (Signed)
Patient is calling, states that when she had her IV for procedure her vein blew, she states that her arm was sore and was bruised. But states that her arm is still swollen and hurts. Procedure was back on 8/10. States she is concerned.

## 2019-10-21 NOTE — Telephone Encounter (Signed)
I am sorry that its not improving. Possible superficial thrombophlebitis, please advise her to continue warm compress 4-6 times a day as tolerated. Please check if she can come in this afternoon for a visit to check the site? Thanks

## 2019-10-22 NOTE — Telephone Encounter (Signed)
No PCP to look at this for her. It is not the IV site, it is the arm above the IV site. Can you look at it for her?  She stayed home from work today because of the discomfort.

## 2019-10-22 NOTE — Telephone Encounter (Signed)
Patient came in, was seen in the pre visit area of 4th floor [place].  No clear swelling or bruising in the arm, she has mild tenderness in the forearm, appears to be secondary to muscle strain. No other swelling noted.  Advised  to apply bengay, alternate warm and cold pack.

## 2019-11-16 ENCOUNTER — Other Ambulatory Visit: Payer: Self-pay | Admitting: Obstetrics and Gynecology

## 2019-11-16 ENCOUNTER — Telehealth: Payer: Self-pay

## 2019-11-16 DIAGNOSIS — Z1231 Encounter for screening mammogram for malignant neoplasm of breast: Secondary | ICD-10-CM

## 2019-11-16 NOTE — Telephone Encounter (Signed)
Patient called B[facility]CP to schedule appointment, asked if she was due for a pap smear as well. Patient informed her last pap was 12/04/2018-LSIL, then had a colposcopy in 01/2019, then repeat colposcopy in 02/2019, and it is protocol to repeat pap testing within 1 year after colposcopy. Patient verbalized understanding, was offered appointment for 01/18/2020 Cervical Screening. Patient declined, stated she wanted to schedule pap smear later than 01/18/2020. Patient informed to request to schedule at 12/17/2019 [facility]CP appointment, 02/2020 schedule not available as of yet. Patient agreed.

## 2019-12-17 ENCOUNTER — Ambulatory Visit: Payer: No Typology Code available for payment source

## 2020-02-02 ENCOUNTER — Ambulatory Visit: Payer: No Typology Code available for payment source

## 2020-03-10 ENCOUNTER — Other Ambulatory Visit: Payer: Self-pay

## 2020-03-10 ENCOUNTER — Ambulatory Visit: Payer: Self-pay | Admitting: *Deleted

## 2020-03-10 VITALS — BP 138/72 | Wt 147.9 lb

## 2020-03-10 DIAGNOSIS — Z1239 Encounter for other screening for malignant neoplasm of breast: Secondary | ICD-10-CM

## 2020-03-10 NOTE — Patient Instructions (Addendum)
Explained breast self awareness with Kittie Plater. Patient did not need a Pap smear today due to patient refused today. Pap smear scheduled for 04/11/20 at 1230 at the free cervical cancer screening clinic. Referred patient to the [facility] for a screening mammogram on the mobile unit. Appointment scheduled Thursday, March 17, 2020 at 0830. Patient aware of appointment and will be there. Let patient know the Breast Center will follow up with her within the next couple weeks with results of her mammogram by letter or phone. Discussed smoking cessation with patient. Referred patient to the [facility] Quitline and gave resources to free smoking cessation classes at [facility]. Melanie Owens verbalized understanding.  Jessyca Sloan, Arvil Chaco, RN 12:04 PM

## 2020-03-10 NOTE — Progress Notes (Signed)
[name] is a 50 y.o. female who presents to [facility] clinic today with no complaints.    Pap Smear: Pap smear not completed today. Last Pap smear was 12/04/2018 at [facility] clinic and was LSIL with negative HPV. Patient had a colposcopy completed 01/21/2019 that was inadequate that a repeat colposcopy was recommended. Patient had the follow-up colposcopy 02/27/2019 that showed CIN-I. Per patient her last Pap smear is the only abnormal Pap smear she has had. Last Pap smear result is available in Epic.   Physical exam: Breasts Breasts symmetrical. No skin abnormalities bilateral breasts. No nipple retraction bilateral breasts. No nipple discharge bilateral breasts. No lymphadenopathy. No lumps palpated bilateral breasts. No complaints of pain or tenderness on exam.       Pelvic/Bimanual Pap is due. Patient refused Pap smear today and scheduled for 04/11/20 at 1230 at the free cervical cancer screening clinic.   Smoking History: Patientis a current smoker. Discussed smoking cessation with patient. Referred patient to the [facility] Quitline and gave resources to free smoking cessation classes at [facility].   Patient Navigation: Patient education provided. Access to services provided for patient through [facility] program.   Colorectal Cancer Screening: Per patient has had colonoscopy completed on 09/22/2019 at [facility]. No complaints today.    Breast and Cervical Cancer Risk Assessment: Patient hasafamily history of mother and paternal grandmother havingbreast cancer. Patient has no history ofknown genetic mutations or radiation treatment to the chest before age 14. Patient has no history of cervical dysplasia, immunocompromised, or DES exposure in-utero.  Risk Assessment    Risk Scores      03/10/2020 12/04/2018   Last edited by: Melanie Revel, LPN Melanie Owens, Melanie Gold, RN   5-year risk: 2.4 % 2.4 %   Lifetime risk: 19.9 % 20.6 %          A: [facility]CP exam without pap smear No  complaints.  P: Referred patient to the [facility] for a screening mammogram on the mobile unit. Appointment scheduled Thursday, March 17, 2020 at 0830.  Melanie Parish, RN 03/10/2020 12:04 PM

## 2020-03-24 ENCOUNTER — Other Ambulatory Visit: Payer: Self-pay

## 2020-03-24 ENCOUNTER — Ambulatory Visit
Admission: RE | Admit: 2020-03-24 | Discharge: 2020-03-24 | Disposition: A | Discharge status: Home / Self Care | Payer: No Typology Code available for payment source | Source: Ambulatory Visit | Attending: Obstetrics and Gynecology | Admitting: Obstetrics and Gynecology

## 2020-03-24 DIAGNOSIS — Z1231 Encounter for screening mammogram for malignant neoplasm of breast: Secondary | ICD-10-CM

## 2020-04-11 ENCOUNTER — Other Ambulatory Visit: Payer: Self-pay

## 2020-04-11 ENCOUNTER — Encounter (INDEPENDENT_AMBULATORY_CARE_PROVIDER_SITE_OTHER): Payer: Self-pay

## 2020-04-11 ENCOUNTER — Other Ambulatory Visit: Payer: Self-pay | Admitting: *Deleted

## 2020-04-11 DIAGNOSIS — Z124 Encounter for screening for malignant neoplasm of cervix: Secondary | ICD-10-CM

## 2020-04-11 NOTE — Progress Notes (Signed)
Patient: Melanie Owens           Date of Birth: September 11, 1970           MRN: [id number] Visit Date: 04/11/2020 PCP: Patient, No Pcp Per  Cervical Cancer Screening Do you smoke?: Yes Have you ever had or been told you have an allergy to latex products?: No Marital status: Single Date of last pap smear: 1-2 yrs ago Date of last menstrual period:  (Postmenopausal) Number of pregnancies: 3 Number of births: 3 Have you ever had any of the following? Hysterectomy: No Tubal ligation (tubes tied): No Abnormal bleeding: No Abnormal pap smear: Yes (12/04/2018 LSIL ([facility]CP), 02/27/2019 Colpo-LSIL, CIN-I [facility]), 04/11/2017 Pap-HPV+, 04/01/2016 Pap-HPV+ ([facility]CP)) Venereal warts: No A sex partner with venereal warts: No A high risk* sex partner: No  Cervical Exam  Abnormal Observations: Normal Exam. Recommendations: Last Pap smear was 12/04/2018 at [facility] clinic and was LSIL with negative HPV. Patient had a colposcopy completed 01/21/2019 that was inadequate that a repeat colposcopy was recommended. Patient had the follow-up colposcopy 02/27/2019 that showed CIN-I. Per patient her last Pap smear is the only abnormal Pap smear she has had. Last Pap smear and colposcopy results are available in Epic. If today's Pap smear normal and HPV negative next Pap smear is due in one year due to patients history.       Patient's History Patient Active Problem List   Diagnosis Date Noted  . Mild dysplasia of cervix (CIN I) 03/03/2019  . LGSIL on Pap smear of cervix 12/04/2018  . Family history of colonic polyps 08/05/2013  . 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 Family history of breast cancer 05/29/2013  . Smoker 05/29/2013  . Galactorrhea 05/29/2013  . Amenorrhea 05/29/2013  . Breast lump on left side at 1 o'clock position 04/22/2012   Past Medical History:  Diagnosis Date  . Depression   . Premature ovarian failure     Family History  Problem Relation Age of Onset  . Breast cancer Mother 54  .  Thyroid cancer Father 42  . Hypertension Father   . Colon polyps Father   . Thyroid cancer Sister 36  . Breast cancer Paternal Grandmother   . Cervical cancer Daughter 8  . Thyroid cancer Sister   . Colon cancer Neg Hx   . Esophageal cancer Neg Hx   . Rectal cancer Neg Hx   . Stomach cancer Neg Hx     Social History   Occupational History  . Not on file  Tobacco Use  . Smoking status: Current Every Day Smoker    Packs/day: 0.50    Years: 25.00    Pack years: 12.50    Types: Cigarettes, E-cigarettes  . Smokeless tobacco: Never Used  . Tobacco comment: has tried to quit  Vaping Use  . Vaping Use: Former  . Devices: used for 6 months when trying to quit 2016  Substance and Sexual Activity  . Alcohol use: No  . Drug use: No  . Sexual activity: Not Currently    Birth control/protection: None, Post-menopausal

## 2020-04-14 ENCOUNTER — Telehealth: Payer: Self-pay

## 2020-04-14 LAB — CYTOLOGY - PAP
Comment: NEGATIVE
Diagnosis: UNDETERMINED — AB
High risk HPV: NEGATIVE

## 2020-04-14 NOTE — Telephone Encounter (Signed)
Attempted to contact patient regarding lab(pap) results. Left message on voicemail requesting a return call.

## 2020-04-15 ENCOUNTER — Telehealth: Payer: Self-pay

## 2020-04-15 NOTE — Telephone Encounter (Signed)
Patient informed pap results, ASC-US (Atypical Cells of Undetermined Significance), HPV-negative, no colposcopy needed. Patient verbalized understanding.

## 2021-04-21 ENCOUNTER — Other Ambulatory Visit: Payer: Self-pay | Admitting: Obstetrics and Gynecology

## 2021-04-21 DIAGNOSIS — Z1231 Encounter for screening mammogram for malignant neoplasm of breast: Secondary | ICD-10-CM

## 2021-06-01 ENCOUNTER — Ambulatory Visit: Payer: No Typology Code available for payment source

## 2021-06-13 ENCOUNTER — Ambulatory Visit
Admission: RE | Admit: 2021-06-13 | Discharge: 2021-06-13 | Disposition: A | Discharge status: Home / Self Care | Payer: No Typology Code available for payment source | Source: Ambulatory Visit | Attending: Obstetrics and Gynecology | Admitting: Obstetrics and Gynecology

## 2021-06-13 ENCOUNTER — Ambulatory Visit: Payer: Self-pay | Admitting: *Deleted

## 2021-06-13 VITALS — BP 114/66 | Wt 146.0 lb

## 2021-06-13 DIAGNOSIS — Z1239 Encounter for other screening for malignant neoplasm of breast: Secondary | ICD-10-CM

## 2021-06-13 DIAGNOSIS — Z1231 Encounter for screening mammogram for malignant neoplasm of breast: Secondary | ICD-10-CM

## 2021-06-13 NOTE — Patient Instructions (Addendum)
Explained breast self awareness with Kittie Plater. Pap is due. Patient refused Pap smear today and will call to reschedule Pap smear. Explained the importance of having Pap smear completed and discussed her history along with the HPV virus. ?Referred patient to the [facility] for a screening mammogram on the mobile unit. Appointment scheduled Tuesday, Jun 13, 2021 at 1540. Patient aware of appointment and will be there. Let patient know the Breast Center  will follow up with her within the next couple weeks with results by letter or phone. Discussed smoking cessation with patient. Referred patient to the [facility] Quitline. Charmayne Sheer Cuervo verbalized understanding. ? ?Artesha Wemhoff, Arvil Chaco, RN ?2:35 PM ? ? ? ?  ?

## 2021-06-13 NOTE — Progress Notes (Signed)
Melanie Owens is a 51 y.o. female who presents to [facility] clinic today with no complaints.  ?  ?Pap Smear: Pap smear completed today. Last Pap smear was 04/11/2020 at the free cervical cancer screening clinic and was abnormal - ASCUS with negative HPV . Patient has a history of one other abnormal Pap smear 12/04/2018 that was LSIL with negative HPV that a colposcopy was completed for follow up 01/21/2019 that was inadequate that a repeat colposcopy was recommended. Patient had the follow-up colposcopy 02/27/2019 that showed CIN-I. Last two Pap smear and colposcopy results are available in Epic.  ? ?Physical exam: ?Breasts ?Breasts symmetrical. No skin abnormalities bilateral breasts. No nipple retraction bilateral breasts. No nipple discharge bilateral breasts. No lymphadenopathy. No lumps palpated bilateral breasts. No complaints of pain or tenderness on exam.    ? ?MM 3D SCREEN BREAST BILATERAL ? ?Result Date: 03/28/2020 ?CLINICAL DATA:  Screening. EXAM: DIGITAL SCREENING BILATERAL MAMMOGRAM WITH TOMOSYNTHESIS AND CAD TECHNIQUE: Bilateral screening digital craniocaudal and mediolateral oblique mammograms were obtained. Bilateral screening digital breast tomosynthesis was performed. The images were evaluated with computer-aided detection. COMPARISON:  Previous exam(s). ACR Breast Density Category b: There are scattered areas of fibroglandular density. FINDINGS: There are no findings suspicious for malignancy. IMPRESSION: No mammographic evidence of malignancy. A result letter of this screening mammogram will be mailed directly to the patient. RECOMMENDATION: Screening mammogram in one year. (Code:SM-B-01Y) BI-RADS CATEGORY  1: Negative. Electronically Signed   By: Nolon Nations M.D.   On: 03/28/2020 16:10  ? ?MS DIGITAL SCREENING TOMO BILATERAL ? ?Result Date: 12/05/2018 ?CLINICAL DATA:  Screening. EXAM: DIGITAL SCREENING BILATERAL MAMMOGRAM WITH TOMO AND CAD COMPARISON:  Previous exam(s). ACR Breast Density  Category b: There are scattered areas of fibroglandular density. FINDINGS: There are no findings suspicious for malignancy. Images were processed with CAD. IMPRESSION: No mammographic evidence of malignancy. A result letter of this screening mammogram will be mailed directly to the patient. RECOMMENDATION: Screening mammogram in one year. (Code:SM-B-01Y) BI-RADS CATEGORY  1: Negative. Electronically Signed   By: Lovey Newcomer M.D.   On: 12/05/2018 16:52  ? ?MS DIGITAL SCREENING TOMO BILATERAL ? ?Result Date: 04/12/2017 ?CLINICAL DATA:  Screening. EXAM: DIGITAL SCREENING BILATERAL MAMMOGRAM WITH TOMO AND CAD COMPARISON:  Previous exam(s). ACR Breast Density Category b: There are scattered areas of fibroglandular density. FINDINGS: There are no findings suspicious for malignancy. Images were processed with CAD. IMPRESSION: No mammographic evidence of malignancy. A result letter of this screening mammogram will be mailed directly to the patient. RECOMMENDATION: Screening mammogram in one year. (Code:SM-B-01Y) BI-RADS CATEGORY  1: Negative. Electronically Signed   By: Curlene Dolphin M.D.   On: 04/12/2017 10:58   ? ?Pelvic/Bimanual ?Pap is due. Patient refused Pap smear today and will call to reschedule Pap smear. Explained the importance of having Pap smear completed and discussed her history along with the HPV virus. ?  ?Smoking History: ?Patient is a current smoker. Discussed smoking cessation with patient. Referred patient to the [facility] Quitline. ?  ?Patient Navigation: ?Patient education provided. Access to services provided for patient through [facility] program.  ? ?Colorectal Cancer Screening: ?Per patient has had colonoscopy completed on 09/22/2019 at [facility]. No complaints today.  ?  ?Breast and Cervical Cancer Risk Assessment: ?Patient has a family history of mother and paternal grandmother having breast cancer. Patient has no history of known genetic mutations or radiation treatment to the chest before age 67. Patient  has history of cervical dysplasia. Patient has no  history of being immunocompromised or DES exposure in-utero. ? ?Risk Assessment   ? ? Risk Scores   ? ?   06/13/2021 03/10/2020  ? Last edited by: Demetrius Revel, LPN [place], Karlton Lemon, LPN  ? 5-year risk: 2.5 % 2.4 %  ? Lifetime risk: 19.6 % 19.9 %  ? ?  ?  ? ?  ? ? ?A: ?B[facility]CP exam without pap smear ?No complaints. ? ?P: ?Referred patient to the [facility] for a screening mammogram on the mobile unit. Appointment scheduled Tuesday, Jun 13, 2021 at 1540. ? ?Loletta Parish, RN ?06/13/2021 2:35 PM   ?

## 2021-08-08 ENCOUNTER — Other Ambulatory Visit: Payer: Self-pay | Admitting: *Deleted

## 2021-08-08 DIAGNOSIS — Z124 Encounter for screening for malignant neoplasm of cervix: Secondary | ICD-10-CM

## 2021-08-10 LAB — CYTOLOGY - PAP
Comment: NEGATIVE
Diagnosis: UNDETERMINED — AB
High risk HPV: NEGATIVE

## 2021-08-14 ENCOUNTER — Telehealth: Payer: Self-pay

## 2021-08-14 NOTE — Telephone Encounter (Signed)
Called patient to give pap smear results. Informed patient that pap smear was ASCUS and HPV was negative. Based on this result and her previous hx her next pap smear will be due in 1 year. Patient voiced understanding.

## 2022-07-19 ENCOUNTER — Encounter: Payer: Self-pay | Admitting: *Deleted

## 2022-07-19 DIAGNOSIS — Z1231 Encounter for screening mammogram for malignant neoplasm of breast: Secondary | ICD-10-CM

## 2022-07-26 ENCOUNTER — Other Ambulatory Visit: Payer: Self-pay | Admitting: Obstetrics and Gynecology

## 2022-07-26 DIAGNOSIS — Z1231 Encounter for screening mammogram for malignant neoplasm of breast: Secondary | ICD-10-CM

## 2022-09-27 ENCOUNTER — Ambulatory Visit
Admission: RE | Admit: 2022-09-27 | Discharge: 2022-09-27 | Disposition: A | Discharge status: Home / Self Care | Payer: No Typology Code available for payment source | Source: Ambulatory Visit | Attending: Obstetrics and Gynecology | Admitting: Obstetrics and Gynecology

## 2022-09-27 ENCOUNTER — Ambulatory Visit: Payer: Self-pay | Admitting: Hematology and Oncology

## 2022-09-27 VITALS — BP 127/85 | Wt 157.0 lb

## 2022-09-27 DIAGNOSIS — Z1239 Encounter for other screening for malignant neoplasm of breast: Secondary | ICD-10-CM

## 2022-09-27 DIAGNOSIS — Z1231 Encounter for screening mammogram for malignant neoplasm of breast: Secondary | ICD-10-CM

## 2022-09-27 NOTE — Progress Notes (Signed)
[name] is a 52 y.o. female who presents to [facility] clinic today with no complaints.    Pap Smear: Pap not smear completed today. Last Pap smear was 08/08/2021 and was abnormal - ASCUS/ with no HPV . Per patient has history of an abnormal Pap smear. Last Pap smear result is available in Epic. Declines Pap smear today. Will schedule with next screening day. 04/11/2020 - ASCUS/ with no HPV; 12/04/2018 - LSIL/ with no HPV; 04/11/2017 - normal with HPV+; 03/22/2016 - normal with HPV+; Colposcopy - LGSIL   Physical exam: Breasts Breasts symmetrical. No skin abnormalities bilateral breasts. No nipple retraction bilateral breasts. No nipple discharge bilateral breasts. No lymphadenopathy. No lumps palpated bilateral breasts. MS DIGITAL SCREENING TOMO BILATERAL  Result Date: 06/14/2021 CLINICAL DATA:  Screening. EXAM: DIGITAL SCREENING BILATERAL MAMMOGRAM WITH TOMOSYNTHESIS AND CAD TECHNIQUE: Bilateral screening digital craniocaudal and mediolateral oblique mammograms were obtained. Bilateral screening digital breast tomosynthesis was performed. The images were evaluated with computer-aided detection. COMPARISON:  Previous exam(s). ACR Breast Density Category b: There are scattered areas of fibroglandular density. FINDINGS: There are no findings suspicious for malignancy. IMPRESSION: No mammographic evidence of malignancy. A result letter of this screening mammogram will be mailed directly to the patient. RECOMMENDATION: Screening mammogram in one year. (Code:SM-B-01Y) BI-RADS CATEGORY  1: Negative. Electronically Signed   By: Sherron Ales M.D.   On: 06/14/2021 13:31   MM 3D SCREEN BREAST BILATERAL  Result Date: 03/28/2020 CLINICAL DATA:  Screening. EXAM: DIGITAL SCREENING BILATERAL MAMMOGRAM WITH TOMOSYNTHESIS AND CAD TECHNIQUE: Bilateral screening digital craniocaudal and mediolateral oblique mammograms were obtained. Bilateral screening digital breast tomosynthesis was performed. The images were  evaluated with computer-aided detection. COMPARISON:  Previous exam(s). ACR Breast Density Category b: There are scattered areas of fibroglandular density. FINDINGS: There are no findings suspicious for malignancy. IMPRESSION: No mammographic evidence of malignancy. A result letter of this screening mammogram will be mailed directly to the patient. RECOMMENDATION: Screening mammogram in one year. (Code:SM-B-01Y) BI-RADS CATEGORY  1: Negative. Electronically Signed   By: Norva Pavlov M.D.   On: 03/28/2020 16:10   MS DIGITAL SCREENING TOMO BILATERAL  Result Date: 12/05/2018 CLINICAL DATA:  Screening. EXAM: DIGITAL SCREENING BILATERAL MAMMOGRAM WITH TOMO AND CAD COMPARISON:  Previous exam(s). ACR Breast Density Category b: There are scattered areas of fibroglandular density. FINDINGS: There are no findings suspicious for malignancy. Images were processed with CAD. IMPRESSION: No mammographic evidence of malignancy. A result letter of this screening mammogram will be mailed directly to the patient. RECOMMENDATION: Screening mammogram in one year. (Code:SM-B-01Y) BI-RADS CATEGORY  1: Negative. Electronically Signed   By: Annia Belt M.D.   On: 12/05/2018 16:52          Pelvic/Bimanual Pap is not indicated today    Smoking History: Patient has is a current smoker at 1 - 1 1/2 packs per day and was referred to quit line.    Patient Navigation: Patient education provided. Access to services provided for patient through [facility] program. No interpreter provided. No transportation provided   Colorectal Cancer Screening: Per patient has had colonoscopy completed on 09/22/2019 revealing tubular adenoma without high-grade dysplasia or malignancy; sessile serrated polyp without cytologic dysplasia; hyperplastic polyps. Repeat in 5 years.  No complaints today.    Breast and Cervical Cancer Risk Assessment: Patient does not have family history of breast cancer, known genetic mutations, or radiation treatment  to the chest before age 40. Patient does not have history of cervical dysplasia, immunocompromised, or  DES exposure in-utero.  Risk Assessment   No risk assessment data for the current encounter  Risk Scores       06/13/2021   Last edited by: Narda Rutherford, LPN   5-year risk: 2.5%   Lifetime risk: 19.6%            A: B[facility]CP exam without pap smear No complaints with benign exam.   P: Referred patient to the [facility] for a screening mammogram. Appointment scheduled 09/27/22.  Ilda Basset A, NP 09/27/2022 2:00 PM

## 2022-09-27 NOTE — Patient Instructions (Signed)
Taught Melanie Owens about self breast awareness and gave educational materials to take home. Patient did not need a Pap smear today due to last Pap smear was in  per patient. Let her know [facility]CP will cover Pap smears every 3 years unless has a history of abnormal Pap smears. Declined Pap smear today. Will schedule with next screening day. Referred patient to the [facility] for screening mammogram. Appointment scheduled for 09/27/22. Patient aware of appointment and will be there. Let patient know will follow up with her within the next couple weeks with results. Kathlee Nations Teo verbalized understanding.  Pascal Lux, NP 2:02 PM

## 2022-11-02 ENCOUNTER — Other Ambulatory Visit: Payer: Self-pay | Admitting: Hematology and Oncology

## 2022-11-02 DIAGNOSIS — Z124 Encounter for screening for malignant neoplasm of cervix: Secondary | ICD-10-CM

## 2022-11-02 NOTE — Progress Notes (Signed)
Patient: Melanie Owens           Date of Birth: [birth year]           MRN: [id number] Visit Date: 11/02/2022 PCP: Patient, No Pcp Per  Cervical Cancer Screening Do you smoke?: Yes Have you ever had or been told you have an allergy to latex products?: No Marital status: Single Date of last pap smear: 1-2 yrs ago (08/08/21) Date of last menstrual period:  (post menopausal) Number of pregnancies: 0 Number of births: 0 Have you ever had any of the following? Hysterectomy: No Tubal ligation (tubes tied): Yes Abnormal bleeding: Yes Abnormal pap smear: Yes ((ASCUS, - HPV) 08/08/21) Venereal warts: No A sex partner with venereal warts: No A high risk* sex partner: No  Cervical Exam Pap smear completed: Pap test Abnormal Observations: Normal exam. Recommendations: Repeat in 1 year if remains ASCUS/ HPV-.      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/04/2018   Family history of colonic polyps 08/05/2013   Premature ovarian failure 08/05/2013   Family history of breast cancer 05/29/2013   Smoker 05/29/2013   Galactorrhea 05/29/2013   Amenorrhea 05/29/2013   Breast lump on left side at 1 o'clock position 04/22/2012   Past Medical History:  Diagnosis Date   Depression    Premature ovarian failure     Family History  Problem Relation Age of Onset   Breast cancer Mother 23   Thyroid cancer Father 91   Hypertension Father    Colon polyps Father    Thyroid cancer Sister 27   Thyroid cancer Sister    Cervical cancer Daughter 12   Breast cancer Paternal Grandmother 70 - 68   Colon cancer Neg Hx    Esophageal cancer Neg Hx    Rectal cancer Neg Hx    Stomach cancer Neg Hx     Social History   Occupational History   Not on file  Tobacco Use   Smoking status: Every Day    Current packs/day: 1.00    Average packs/day: 1 pack/day for 25.0 years (25.0 ttl pk-yrs)     Types: Cigarettes, E-cigarettes   Smokeless tobacco: Never   Tobacco comments:    has tried to quit  Vaping Use   Vaping status: Former   Devices: used for 6 months when trying to quit 2016  Substance and Sexual Activity   Alcohol use: No   Drug use: No   Sexual activity: Not Currently    Birth control/protection: None, Post-menopausal

## 2022-11-07 ENCOUNTER — Telehealth: Payer: Self-pay

## 2022-11-07 LAB — CYTOLOGY - PAP
Comment: NEGATIVE
Diagnosis: NEGATIVE
High risk HPV: NEGATIVE

## 2022-11-07 NOTE — Telephone Encounter (Signed)
Called patient to give pap smear results. Informed patient that pap smear was normal and HPV was negative. Based on this result and her previous hx her next pap smear will be due in 1 year. Patient voiced understanding.

## 2023-09-20 ENCOUNTER — Other Ambulatory Visit: Payer: Self-pay | Admitting: Obstetrics and Gynecology

## 2023-09-20 DIAGNOSIS — Z1231 Encounter for screening mammogram for malignant neoplasm of breast: Secondary | ICD-10-CM

## 2023-11-21 ENCOUNTER — Encounter

## 2023-11-21 ENCOUNTER — Ambulatory Visit

## 2024-01-30 ENCOUNTER — Ambulatory Visit: Payer: Self-pay

## 2024-01-30 ENCOUNTER — Other Ambulatory Visit: Payer: Self-pay | Admitting: Obstetrics and Gynecology

## 2024-01-30 ENCOUNTER — Inpatient Hospital Stay
Admission: RE | Admit: 2024-01-30 | Discharge: 2024-01-30 | Attending: Obstetrics and Gynecology | Admitting: Obstetrics and Gynecology

## 2024-01-30 ENCOUNTER — Encounter

## 2024-01-30 VITALS — BP 127/84 | Ht 64.0 in | Wt 151.0 lb

## 2024-01-30 DIAGNOSIS — Z1231 Encounter for screening mammogram for malignant neoplasm of breast: Secondary | ICD-10-CM

## 2024-01-30 DIAGNOSIS — Z803 Family history of malignant neoplasm of breast: Secondary | ICD-10-CM

## 2024-01-30 DIAGNOSIS — Z1239 Encounter for other screening for malignant neoplasm of breast: Secondary | ICD-10-CM

## 2024-01-30 NOTE — Patient Instructions (Signed)
 Taught Melanie Owens how to perform BSE and gave educational materials to take home. Patient did not need a Pap smear today due to last Pap smear was in *** per patient. Told patient about free cervical cancer screenings to receive a Pap smear if would like one next year. Let her know [facility] will cover Pap smears every 3 years unless has a history of abnormal Pap smears. Referred patient to the [facility] for diagnostic mammogram. Appointment scheduled for ***, 2014 at ***. Patient aware of appointment and will be there. Let patient know will follow up with her within the next couple weeks with results. Melanie Owens verbalized understanding.  Kimmie Berggren, Wanda Ship, RN @T @ 3:29 PM

## 2024-01-30 NOTE — Progress Notes (Unsigned)
 [name] is a 53 y.o. female who presents to [facility] clinic today with {Blank single:19197::no complaints,complaint of} ***.    Pap Smear: Pap not smear completed today. Last Pap smear was *** at *** clinic and was {Blank single:19197::normal,abnormal - ***}. Per patient has {Blank single:19197::no history,history} of an abnormal Pap smear. Last Pap smear result {Blank single:19197::is available in,is not available in} Epic.   Physical exam: Breasts Breasts symmetrical. No skin abnormalities bilateral breasts. No nipple retraction bilateral breasts. No nipple discharge bilateral breasts. No lymphadenopathy. No lumps palpated bilateral breasts.       Pelvic/Bimanual Pap is not indicated today    Smoking History: Patient has {Blank single:19197::never smoked,is a former smoker,is a current smoker at *** packs per day} ***referred to quit line.    Patient Navigation: Patient education provided. Access to services provided for patient through [facility] program. *** interpreter provided. *** transportation provided   Colorectal Cancer Screening: Per patient {Blank single:19197::has had colonoscopy completed on ***,has never had colonoscopy completed} No complaints today.    Breast and Cervical Cancer Risk Assessment: Patient {Blank single:19197::has,does not have} family history of breast cancer, known genetic mutations, or radiation treatment to the chest before age 61. Patient {Blank single:19197::has,does not have} history of cervical dysplasia, immunocompromised, or DES exposure in-utero.  Risk Scores as of Encounter on 01/30/2024     Alisa           5-year 2.5%   Lifetime 17.48%            Last calculated by Silas, Ansyi K, CMA on 01/30/2024 at  3:18 PM        A: [facility] exam without pap smear Complaint of ***  P: Referred patient to the [facility] for a {Blank single:19197::screening,diagnostic} mammogram.  Appointment scheduled ***.  Driscilla Wanda SQUIBB, RN 01/30/2024 3:29 PM

## 2024-03-12 ENCOUNTER — Telehealth: Payer: Self-pay

## 2024-03-12 DIAGNOSIS — Z122 Encounter for screening for malignant neoplasm of respiratory organs: Secondary | ICD-10-CM

## 2024-03-12 DIAGNOSIS — F17210 Nicotine dependence, cigarettes, uncomplicated: Secondary | ICD-10-CM

## 2024-03-12 DIAGNOSIS — Z87891 Personal history of nicotine dependence: Secondary | ICD-10-CM

## 2024-03-12 NOTE — Telephone Encounter (Signed)
 Lung Cancer Screening Narrative/Criteria Questionnaire (Cigarette Smokers Only- No Cigars/Pipes/vapes)   Melanie Owens   SDMV:03/18/2024 at 10:00 [name]        Sep 26, 1970               LDCT: 03/25/2024 at 1:00 pm GI    54 y.o.   Phone: [phone]   Lung Screening Narrative (confirm age 62-77 yrs Medicare / 50-80 yrs Private pay insurance)   Insurance information: [employer]    Referring Provider:  Alger, MD  This screening involves an initial phone call with a team member from our program. It is called a shared decision making visit. The initial meeting is required by  insurance and Medicare to make sure you understand the program. This appointment takes about 15-20 minutes to complete. You will complete the screening scan at your scheduled date/time.  This scan takes about 5-10 minutes to complete. You can eat and drink normally before and after the scan.  Criteria questions for Lung Cancer Screening:   Are you a current or former smoker? Current Age began smoking: 15   If you are a former smoker, what year did you quit smoking? Never quit. (within 15 yrs)   To calculate your smoking history, I need an accurate estimate of how many packs of cigarettes you smoked per day and for how many years. (Not just the number of PPD you are now smoking)   Years smoking 39 x Packs per day 1.5 = Pack years 58.5   (at least 20 pack yrs)   (Make sure they understand that we need to know how much they have smoked in the past, not just the number of PPD they are smoking now)  Do you have a personal history of cancer?  No    Do you have a family history of cancer? Yes  (cancer type and and relative) Father had lung cancer. Mother and grandmother had breast cancer.   Are you coughing up blood?  No  Have you had unexplained weight loss of 15 lbs or more in the last 6 months? No  It looks like you meet all criteria.  When would be a good time for us  to schedule you for this  screening?   Additional information: N/A

## 2024-03-18 ENCOUNTER — Encounter: Payer: Self-pay | Admitting: Acute Care

## 2024-03-18 ENCOUNTER — Encounter: Admitting: Adult Health

## 2024-03-18 ENCOUNTER — Ambulatory Visit: Admitting: Acute Care

## 2024-03-18 NOTE — Patient Instructions (Signed)
 Thank you for participating in the [facility] Lung Cancer Screening Program. It was our pleasure to meet you today. We will call you with the results of your scan within the next few days. Your scan will be assigned a Lung RADS category score by the physicians reading the scans.  This Lung RADS score determines follow up scanning.  See below for description of categories, and follow up screening recommendations. We will be in touch to schedule your follow up screening annually or based on recommendations of our providers. We will fax a copy of your scan results to your Primary Care Physician, or the physician who referred you to the program, to ensure they have the results. Please call the office if you have any questions or concerns regarding your scanning experience or results.  Our office number is [phone]. Please speak with Karna Curly, RN., Karna Doom RN, or [facility] RN, and Isaiah Dover RN. They are  our Lung Cancer Screening RN.'s If They are unavailable when you call, Please leave a message on the voice mail. We will return your call at our earliest convenience.This voice mail is monitored several times a day.  Remember, if your scan is normal, we will scan you annually as long as you continue to meet the criteria for the program. (Age 14-80, Current smoker or smoker who has quit within the last 15 years). If you are a smoker, remember, quitting is the single most powerful action that you can take to decrease your risk of lung cancer and other pulmonary, breathing related problems. We know quitting is hard, and we are here to help.  Please let us  know if there is anything we can do to help you meet your goal of quitting. If you are a former smoker, counselling psychologist. We are proud of you! Remain smoke free! Remember you can refer friends or family members through the number above.  We will screen them to make sure they meet criteria for the program. Thank you for helping us   take better care of you by participating in Lung Screening.  Lung RADS Categories:  Lung RADS 1: no nodules or definitely non-concerning nodules.  Recommendation is for a repeat annual scan in 12 months.  Lung RADS 2:  nodules that are non-concerning in appearance and behavior with a very low likelihood of becoming an active cancer. Recommendation is for a repeat annual scan in 12 months.  Lung RADS 3: nodules that are probably non-concerning , includes nodules with a low likelihood of becoming an active cancer.  Recommendation is for a 11-month repeat screening scan. Often noted after an upper respiratory illness. We will be in touch to make sure you have no questions, and to schedule your 110-month scan.  Lung RADS 4 A: nodules with concerning findings, recommendation is most often for a follow up scan in 3 months or additional testing based on our provider's assessment of the scan. We will be in touch to make sure you have no questions and to schedule the recommended 3 month follow up scan.  Lung RADS 4 B:  indicates findings that are concerning. We will be in touch with you to schedule additional diagnostic testing based on our provider's  assessment of the scan.  Smoking Cessation  Tips to Quit:  Pick a Quit Day within the next week.  Remove temptations: toss cigarettes, lighters, ashtrays.  Tell someone you trust for support.  Avoid triggers like stress, boredom, or being around smokers.  Use healthy replacements: water,  gum, walking, deep breaths.  Stay busy with hobbies, music, drawing, or exercise.  Be patient with yourself--slipping once doesnt mean failure.    Smoking/Nicotine Cessation Resources  If youre ready to quit TODAY, our virtual care team is available to start your journey to a tobacco free life.  Appointments are available from 8 a.m. to 8 p.m. Monday to Friday. Most health insurance plans will cover some level of tobacco cessation visits and medications.   To make a virtual appointment and access other resources, follow the link: severties.nl   [facility] (1-800-QUITNOW) [employer] offers free combination nicotine replacement therapy (patches plus either gum or lozenges). When used along with counseling, this will more than double your chances of quitting successfully.  Call 1-800-QUIT NOW or Text: Ready to 34[address].   Spanish language portal: 1-855-DEJELO-YA 985-848-3971).  TTY: 609-089-2582 American Indian Quitline: Call 888-7AI-QUIT 2511525626) http://carroll-castaneda.info/  The American Lung Association offers Freedom From Smoking Programs Self guided or group programs offered, check their website for free virtual programs available to [facility] residents Lung Helpline at 1-800-LUNGUSA  http://keith.biz/  [employer] Offers tools and tips to quit smoking.  Free quitSTART app:   Monitor progress, manage cravings, access tools, and more with the app.  portablegrid.se  Hypnosis for smoking cessation  [employer] 848-512-0891  Acupuncture for smoking cessation  [employer] [facility] ([phone]    Please let us  know how we can support you as you quit smoking. I know this is hard, but you can do this!    Your CT scan is scheduled for 03/25/2024 at 1 pm at [facility].

## 2024-03-18 NOTE — Progress Notes (Addendum)
 Virtual Visit via Telephone Note  I connected with Melanie Owens on 03/18/24 at 10:00 AM EST by telephone and verified that I am speaking with the correct person using two identifiers.  Location: Patient: At home, in [place]  Provider: [address] [place], [place], [address]    I discussed the limitations, risks, security and privacy concerns of performing an evaluation and management service by telephone and the availability of in person appointments. I also discussed with the patient that there may be a patient responsible charge related to this service. The patient expressed understanding and agreed to proceed.  Shared Decision Making Visit Lung Cancer Screening Program [phone])   Eligibility: Age 54 y.o. Pack Years Smoking History Calculation 58.5 pack years  (# packs/per year x # years smoked) Recent History of coughing up blood  no Unexplained weight loss? no ( >Than 15 pounds within the last 6 months ) Prior History Lung / other cancer no (Diagnosis within the last 5 years already requiring surveillance chest CT Scans). Smoking Status Current Smoker Former Smokers: Years since quit: N/A  Quit Date: N/A  Visit Components: Discussion included one or more decision making aids. yes Discussion included risk/benefits of screening. yes Discussion included potential follow up diagnostic testing for abnormal scans. yes Discussion included meaning and risk of over diagnosis. yes Discussion included meaning and risk of False Positives. yes Discussion included meaning of total radiation exposure. yes  Counseling Included: Importance of adherence to annual lung cancer LDCT screening. yes Impact of comorbidities on ability to participate in the program. yes Ability and willingness to under diagnostic treatment. yes  Smoking Cessation Counseling: Current Smokers:  Discussed importance of smoking cessation. yes Information about tobacco cessation classes and interventions  provided to patient. yes Patient provided with ticket for LDCT Scan. N/A Symptomatic Patient. yes  Counseling(Intermediate counseling: > three minutes) 99406 Diagnosis Code: Tobacco Use Z72.0 Asymptomatic Patient no  Counseling  Former Smokers:  Discussed the importance of maintaining cigarette abstinence. N/A Diagnosis Code: Personal History of Nicotine Dependence. S12.108 Information about tobacco cessation classes and interventions provided to patient. Yes Patient provided with ticket for LDCT Scan. N/A Written Order for Lung Cancer Screening with LDCT placed in Epic. Yes (CT Chest Lung Cancer Screening Low Dose W/O CM) [id number] Z12.2-Screening of respiratory organs Z87.891-Personal history of nicotine dependence  Melanie Owens is a current user of tobacco or nicotine products. She is not ready to quit at this time. She has had a lot of stress recently and thinks that if she did consider quitting that she would go cold turkey. Discussed different strategies that will be provided in AVS for her when she decides to quit smoking, she verbalized understanding.  Counseling provided today addressed the risks of continued use and the benefits of cessation. Discussed tobacco/nicotine use history, readiness to quit, and evidence-based treatment options including behavioral strategies, support resources, and pharmacologic therapies. Provided encouragement and educational materials on steps and resources to quit smoking. Patient questions were addressed, and follow-up recommended for continued support. Total time spent on counseling: 3 minutes.   Wells CHRISTELLA [place], FNP

## 2024-03-25 ENCOUNTER — Telehealth

## 2024-03-25 ENCOUNTER — Other Ambulatory Visit

## 2024-04-16 ENCOUNTER — Ambulatory Visit
